# Patient Record
Sex: Female | Born: 1950 | Race: White | Hispanic: No | Marital: Married | Smoking: Never smoker
Health system: Southern US, Community
[De-identification: ages and names within clinical notes are randomized; demographics above are authoritative.]

## PROBLEM LIST (undated history)

## (undated) DIAGNOSIS — E669 Obesity, unspecified: Secondary | ICD-10-CM

## (undated) DIAGNOSIS — M2040 Other hammer toe(s) (acquired), unspecified foot: Secondary | ICD-10-CM

## (undated) DIAGNOSIS — K219 Gastro-esophageal reflux disease without esophagitis: Secondary | ICD-10-CM

## (undated) DIAGNOSIS — J45909 Unspecified asthma, uncomplicated: Secondary | ICD-10-CM

## (undated) DIAGNOSIS — M51369 Other intervertebral disc degeneration, lumbar region without mention of lumbar back pain or lower extremity pain: Secondary | ICD-10-CM

## (undated) DIAGNOSIS — R7303 Prediabetes: Secondary | ICD-10-CM

## (undated) DIAGNOSIS — F325 Major depressive disorder, single episode, in full remission: Secondary | ICD-10-CM

## (undated) DIAGNOSIS — I739 Peripheral vascular disease, unspecified: Secondary | ICD-10-CM

## (undated) DIAGNOSIS — H9192 Unspecified hearing loss, left ear: Secondary | ICD-10-CM

## (undated) DIAGNOSIS — L4050 Arthropathic psoriasis, unspecified: Secondary | ICD-10-CM

## (undated) DIAGNOSIS — E785 Hyperlipidemia, unspecified: Secondary | ICD-10-CM

## (undated) DIAGNOSIS — K259 Gastric ulcer, unspecified as acute or chronic, without hemorrhage or perforation: Secondary | ICD-10-CM

## (undated) DIAGNOSIS — M150 Primary generalized (osteo)arthritis: Secondary | ICD-10-CM

## (undated) DIAGNOSIS — I639 Cerebral infarction, unspecified: Secondary | ICD-10-CM

## (undated) DIAGNOSIS — M5136 Other intervertebral disc degeneration, lumbar region: Secondary | ICD-10-CM

## (undated) DIAGNOSIS — L409 Psoriasis, unspecified: Secondary | ICD-10-CM

## (undated) DIAGNOSIS — H9190 Unspecified hearing loss, unspecified ear: Secondary | ICD-10-CM

## (undated) DIAGNOSIS — M48062 Spinal stenosis, lumbar region with neurogenic claudication: Secondary | ICD-10-CM

## (undated) DIAGNOSIS — Z9889 Other specified postprocedural states: Secondary | ICD-10-CM

## (undated) DIAGNOSIS — T39395A Adverse effect of other nonsteroidal anti-inflammatory drugs [NSAID], initial encounter: Secondary | ICD-10-CM

## (undated) DIAGNOSIS — M545 Low back pain, unspecified: Secondary | ICD-10-CM

## (undated) DIAGNOSIS — R519 Headache, unspecified: Secondary | ICD-10-CM

## (undated) DIAGNOSIS — N809 Endometriosis, unspecified: Secondary | ICD-10-CM

## (undated) DIAGNOSIS — F329 Major depressive disorder, single episode, unspecified: Secondary | ICD-10-CM

## (undated) DIAGNOSIS — C801 Malignant (primary) neoplasm, unspecified: Secondary | ICD-10-CM

## (undated) DIAGNOSIS — I679 Cerebrovascular disease, unspecified: Secondary | ICD-10-CM

## (undated) DIAGNOSIS — F419 Anxiety disorder, unspecified: Secondary | ICD-10-CM

## (undated) DIAGNOSIS — Z974 Presence of external hearing-aid: Secondary | ICD-10-CM

## (undated) DIAGNOSIS — I10 Essential (primary) hypertension: Secondary | ICD-10-CM

## (undated) DIAGNOSIS — G709 Myoneural disorder, unspecified: Secondary | ICD-10-CM

## (undated) DIAGNOSIS — R51 Headache: Secondary | ICD-10-CM

## (undated) DIAGNOSIS — F32A Depression, unspecified: Secondary | ICD-10-CM

## (undated) HISTORY — PX: JOINT REPLACEMENT: SHX530

## (undated) HISTORY — PX: OTHER SURGICAL HISTORY: SHX169

## (undated) HISTORY — PX: COLONOSCOPY: SHX174

## (undated) HISTORY — PX: LIVER BIOPSY: SHX301

## (undated) HISTORY — PX: INNER EAR SURGERY: SHX679

## (undated) HISTORY — PX: CATARACT EXTRACTION: SUR2

## (undated) HISTORY — PX: TONSILLECTOMY: SUR1361

## (undated) SURGERY — Surgical Case
Anesthesia: *Unknown

---

## 1988-02-05 HISTORY — PX: ABDOMINAL HYSTERECTOMY: SHX81

## 2003-12-06 ENCOUNTER — Ambulatory Visit: Payer: Self-pay | Admitting: Internal Medicine

## 2004-02-03 ENCOUNTER — Ambulatory Visit: Payer: Self-pay | Admitting: Unknown Physician Specialty

## 2004-06-18 ENCOUNTER — Ambulatory Visit: Payer: Self-pay

## 2005-01-29 ENCOUNTER — Ambulatory Visit: Payer: Self-pay | Admitting: Internal Medicine

## 2005-08-05 ENCOUNTER — Other Ambulatory Visit: Payer: Self-pay

## 2005-08-12 ENCOUNTER — Inpatient Hospital Stay: Payer: Self-pay | Admitting: Unknown Physician Specialty

## 2005-08-20 ENCOUNTER — Ambulatory Visit: Payer: Self-pay | Admitting: Unknown Physician Specialty

## 2006-02-11 ENCOUNTER — Ambulatory Visit: Payer: Self-pay | Admitting: Internal Medicine

## 2006-07-02 ENCOUNTER — Ambulatory Visit: Payer: Self-pay | Admitting: Unknown Physician Specialty

## 2006-12-17 ENCOUNTER — Ambulatory Visit: Payer: Self-pay | Admitting: Unknown Physician Specialty

## 2007-03-18 ENCOUNTER — Ambulatory Visit: Payer: Self-pay | Admitting: Internal Medicine

## 2007-04-28 ENCOUNTER — Ambulatory Visit: Payer: Self-pay | Admitting: Unknown Physician Specialty

## 2007-05-06 ENCOUNTER — Ambulatory Visit: Payer: Self-pay | Admitting: Unknown Physician Specialty

## 2009-02-01 ENCOUNTER — Ambulatory Visit: Payer: Self-pay | Admitting: Internal Medicine

## 2010-02-02 ENCOUNTER — Ambulatory Visit: Payer: Self-pay | Admitting: Internal Medicine

## 2011-05-17 ENCOUNTER — Ambulatory Visit: Payer: Self-pay | Admitting: Unknown Physician Specialty

## 2011-06-06 ENCOUNTER — Ambulatory Visit: Payer: Self-pay | Admitting: Unknown Physician Specialty

## 2011-07-29 ENCOUNTER — Ambulatory Visit: Payer: Self-pay | Admitting: Internal Medicine

## 2012-05-14 ENCOUNTER — Ambulatory Visit: Payer: Self-pay | Admitting: Ophthalmology

## 2012-05-14 LAB — CREATININE, SERUM
EGFR (African American): >60
EGFR (Non-African Amer.): >60

## 2012-08-04 ENCOUNTER — Ambulatory Visit: Payer: Self-pay | Admitting: Internal Medicine

## 2013-08-31 ENCOUNTER — Ambulatory Visit: Payer: Self-pay | Admitting: Ophthalmology

## 2013-09-14 ENCOUNTER — Ambulatory Visit: Payer: Self-pay | Admitting: Ophthalmology

## 2013-09-21 ENCOUNTER — Ambulatory Visit: Payer: Self-pay | Admitting: Ophthalmology

## 2013-12-15 ENCOUNTER — Ambulatory Visit: Payer: Self-pay | Admitting: Internal Medicine

## 2014-03-29 ENCOUNTER — Ambulatory Visit: Payer: Self-pay | Admitting: Unknown Physician Specialty

## 2014-05-28 NOTE — Op Note (Signed)
PATIENT NAME:  Andrea Fuentes, Andrea Fuentes MR#:  [id number] DATE OF BIRTH:  1950-09-01  DATE OF PROCEDURE:  09/21/2013  PREOPERATIVE DIAGNOSIS: Visually significant cataract of the right eye.   POSTOPERATIVE DIAGNOSIS: Visually significant cataract of the right eye.   OPERATIVE PROCEDURE: Cataract extraction by phacoemulsification with implant of intraocular lens to right eye.   SURGEON: Birder Robson, MD.   ANESTHESIA:  1. Managed anesthesia care.  2. Topical tetracaine drops followed by 2% Xylocaine jelly applied in the preoperative holding area.   COMPLICATIONS: None.   TECHNIQUE:  Stop and chop.   DESCRIPTION OF PROCEDURE: The patient was examined and consented in the preoperative holding area where the aforementioned topical anesthesia was applied to the right eye and then brought back to the operating room where the right eye was prepped and draped in the usual sterile ophthalmic fashion and a lid speculum was placed. A paracentesis was created with the side port blade and the anterior chamber was filled with viscoelastic. A near clear corneal incision was performed with the steel keratome. A continuous curvilinear capsulorrhexis was performed with a cystotome followed by the capsulorrhexis forceps. Hydrodissection and hydrodelineation were carried out with BSS on a blunt cannula. The lens was removed in a stop-and-chop technique and the remaining cortical material was removed with the irrigation-aspiration handpiece. The capsular bag was inflated with viscoelastic and the Tecnis ZCB00, 24.0-diopter lens, serial number [id number] was placed in the capsular bag without complication. The remaining viscoelastic was removed from the eye with the irrigation-aspiration handpiece. The wounds were hydrated. The anterior chamber was flushed with Miostat and the eye was inflated to physiologic pressure. Please note that cefuroxime was not placed within the eye due to PENICILLIN allergy. Rather, a 4:1 dilution  of Vigamox was placed. The wounds were found to be water tight. The eye was dressed with Vigamox. The patient was given protective glasses to wear throughout the day and a shield with which to sleep tonight. The patient was also given drops with which to begin a drop regimen today and will follow-up with me in one day.    ____________________________ Livingston Diones. Vonita Calloway, MD wlp:ST D: 09/21/2013 20:30:21 ET T: 09/21/2013 21:56:58 ET JOB#: [id number]  cc: Shabrea Weldin L. Leenah Seidner, MD, <Dictator> Livingston Diones Muranda Coye MD ELECTRONICALLY SIGNED 09/22/2013 8:51

## 2014-11-24 ENCOUNTER — Other Ambulatory Visit: Payer: Self-pay | Admitting: Internal Medicine

## 2014-11-24 DIAGNOSIS — Z1231 Encounter for screening mammogram for malignant neoplasm of breast: Secondary | ICD-10-CM

## 2014-12-19 ENCOUNTER — Ambulatory Visit
Admission: RE | Admit: 2014-12-19 | Discharge: 2014-12-19 | Disposition: A | Discharge status: Home / Self Care | Payer: Medicare Other | Source: Ambulatory Visit | Attending: Internal Medicine | Admitting: Internal Medicine

## 2014-12-19 ENCOUNTER — Other Ambulatory Visit: Payer: Self-pay | Admitting: Internal Medicine

## 2014-12-19 DIAGNOSIS — Z1231 Encounter for screening mammogram for malignant neoplasm of breast: Secondary | ICD-10-CM

## 2015-02-13 ENCOUNTER — Ambulatory Visit: Admission: RE | Admit: 2015-02-13 | Payer: Medicare Other | Source: Ambulatory Visit | Admitting: Gastroenterology

## 2015-02-13 ENCOUNTER — Encounter: Admission: RE | Payer: Self-pay | Source: Ambulatory Visit

## 2015-02-13 SURGERY — COLONOSCOPY WITH PROPOFOL
Anesthesia: General

## 2015-03-24 ENCOUNTER — Encounter: Payer: Self-pay | Admitting: *Deleted

## 2015-03-27 ENCOUNTER — Ambulatory Visit: Payer: Medicare Other | Admitting: Anesthesiology

## 2015-03-27 ENCOUNTER — Encounter
Admission: RE | Disposition: A | Discharge status: Home Health | Payer: Self-pay | Source: Ambulatory Visit | Attending: Gastroenterology

## 2015-03-27 ENCOUNTER — Ambulatory Visit
Admission: RE | Admit: 2015-03-27 | Discharge: 2015-03-27 | Disposition: A | Discharge status: Home Health | Payer: Medicare Other | Source: Ambulatory Visit | Attending: Gastroenterology | Admitting: Gastroenterology

## 2015-03-27 ENCOUNTER — Encounter: Payer: Self-pay | Admitting: *Deleted

## 2015-03-27 DIAGNOSIS — L408 Other psoriasis: Secondary | ICD-10-CM | POA: Insufficient documentation

## 2015-03-27 DIAGNOSIS — I10 Essential (primary) hypertension: Secondary | ICD-10-CM | POA: Insufficient documentation

## 2015-03-27 DIAGNOSIS — M5416 Radiculopathy, lumbar region: Secondary | ICD-10-CM | POA: Insufficient documentation

## 2015-03-27 DIAGNOSIS — Z6832 Body mass index (BMI) 32.0-32.9, adult: Secondary | ICD-10-CM | POA: Diagnosis not present

## 2015-03-27 DIAGNOSIS — E785 Hyperlipidemia, unspecified: Secondary | ICD-10-CM | POA: Diagnosis not present

## 2015-03-27 DIAGNOSIS — I739 Peripheral vascular disease, unspecified: Secondary | ICD-10-CM | POA: Insufficient documentation

## 2015-03-27 DIAGNOSIS — M545 Low back pain: Secondary | ICD-10-CM | POA: Insufficient documentation

## 2015-03-27 DIAGNOSIS — E669 Obesity, unspecified: Secondary | ICD-10-CM | POA: Insufficient documentation

## 2015-03-27 DIAGNOSIS — Z7982 Long term (current) use of aspirin: Secondary | ICD-10-CM | POA: Diagnosis not present

## 2015-03-27 DIAGNOSIS — M4806 Spinal stenosis, lumbar region: Secondary | ICD-10-CM | POA: Insufficient documentation

## 2015-03-27 DIAGNOSIS — Z90710 Acquired absence of both cervix and uterus: Secondary | ICD-10-CM | POA: Diagnosis not present

## 2015-03-27 DIAGNOSIS — Z1211 Encounter for screening for malignant neoplasm of colon: Secondary | ICD-10-CM | POA: Diagnosis not present

## 2015-03-27 DIAGNOSIS — Z96653 Presence of artificial knee joint, bilateral: Secondary | ICD-10-CM | POA: Diagnosis not present

## 2015-03-27 DIAGNOSIS — J45909 Unspecified asthma, uncomplicated: Secondary | ICD-10-CM | POA: Diagnosis not present

## 2015-03-27 DIAGNOSIS — F419 Anxiety disorder, unspecified: Secondary | ICD-10-CM | POA: Insufficient documentation

## 2015-03-27 DIAGNOSIS — F329 Major depressive disorder, single episode, unspecified: Secondary | ICD-10-CM | POA: Insufficient documentation

## 2015-03-27 DIAGNOSIS — H9192 Unspecified hearing loss, left ear: Secondary | ICD-10-CM | POA: Insufficient documentation

## 2015-03-27 DIAGNOSIS — Z79899 Other long term (current) drug therapy: Secondary | ICD-10-CM | POA: Insufficient documentation

## 2015-03-27 DIAGNOSIS — Z8673 Personal history of transient ischemic attack (TIA), and cerebral infarction without residual deficits: Secondary | ICD-10-CM | POA: Diagnosis not present

## 2015-03-27 HISTORY — DX: Unspecified hearing loss, left ear: H91.92

## 2015-03-27 HISTORY — DX: Hyperlipidemia, unspecified: E78.5

## 2015-03-27 HISTORY — DX: Endometriosis, unspecified: N80.9

## 2015-03-27 HISTORY — DX: Low back pain, unspecified: M54.50

## 2015-03-27 HISTORY — DX: Other specified postprocedural states: Z98.890

## 2015-03-27 HISTORY — DX: Spinal stenosis, lumbar region with neurogenic claudication: M48.062

## 2015-03-27 HISTORY — DX: Low back pain: M54.5

## 2015-03-27 HISTORY — DX: Anxiety disorder, unspecified: F41.9

## 2015-03-27 HISTORY — DX: Unspecified asthma, uncomplicated: J45.909

## 2015-03-27 HISTORY — DX: Psoriasis, unspecified: L40.9

## 2015-03-27 HISTORY — DX: Cerebral infarction, unspecified: I63.9

## 2015-03-27 HISTORY — DX: Major depressive disorder, single episode, unspecified: F32.9

## 2015-03-27 HISTORY — DX: Obesity, unspecified: E66.9

## 2015-03-27 HISTORY — PX: COLONOSCOPY WITH PROPOFOL: SHX5780

## 2015-03-27 HISTORY — DX: Depression, unspecified: F32.A

## 2015-03-27 HISTORY — DX: Essential (primary) hypertension: I10

## 2015-03-27 HISTORY — DX: Peripheral vascular disease, unspecified: I73.9

## 2015-03-27 SURGERY — COLONOSCOPY WITH PROPOFOL
Anesthesia: General

## 2015-03-27 MED ORDER — PROPOFOL 500 MG/50ML IV EMUL
INTRAVENOUS | Status: DC | PRN
  Administered 2015-03-27: 100 ug/kg/min via INTRAVENOUS

## 2015-03-27 MED ORDER — SODIUM CHLORIDE 0.9 % IV SOLN
INTRAVENOUS | Status: DC
  Administered 2015-03-27: 12:00:00 via INTRAVENOUS

## 2015-03-27 MED ORDER — SODIUM CHLORIDE 0.9 % IV SOLN
INTRAVENOUS | Status: DC
  Administered 2015-03-27: 1000 mL via INTRAVENOUS

## 2015-03-27 NOTE — Anesthesia Preprocedure Evaluation (Signed)
Anesthesia Evaluation  Patient identified by MRN, date of birth, ID band Patient awake    Reviewed: Allergy & Precautions, H&P , NPO status , Patient's Chart, lab work & pertinent test results, reviewed documented beta blocker date and time   History of Anesthesia Complications Negative for: history of anesthetic complications  Airway Mallampati: III  TM Distance: >3 FB Neck ROM: full    Dental no notable dental hx. (+) Missing, Chipped, Caps   Pulmonary neg shortness of breath, asthma , neg sleep apnea, neg COPD, neg recent URI,    Pulmonary exam normal breath sounds clear to auscultation       Cardiovascular Exercise Tolerance: Good hypertension, On Medications (-) angina+ Peripheral Vascular Disease  (-) CAD, (-) Past MI, (-) Cardiac Stents and (-) CABG Normal cardiovascular exam(-) dysrhythmias + Valvular Problems/Murmurs  Rhythm:regular Rate:Normal     Neuro/Psych neg Seizures PSYCHIATRIC DISORDERS (Depression) CVA negative psych ROS   GI/Hepatic Neg liver ROS, GERD  ,  Endo/Other  negative endocrine ROS  Renal/GU negative Renal ROS  negative genitourinary   Musculoskeletal   Abdominal   Peds  Hematology negative hematology ROS (+)   Anesthesia Other Findings Past Medical History:   Hyperlipidemia                                               Hypertension                                                 Depression                                                   Peripheral vascular disease (HCC)                            Asthma                                                       Obesity                                                      Anxiety                                                      Low back pain                                                Lumbar radiculitis  Lumbar stenosis with neurogenic claudication                 Stroke [facility])                                                  Deafness in left ear                                         Endometriosis                                                Psoriasis                                                    S/P ear surgery                                              History of esophagogastroduodenoscopy (EGD)                  Reproductive/Obstetrics negative OB ROS                             Anesthesia Physical Anesthesia Plan  ASA: III  Anesthesia Plan: General   Post-op Pain Management:    Induction:   Airway Management Planned:   Additional Equipment:   Intra-op Plan:   Post-operative Plan:   Informed Consent: I have reviewed the patients History and Physical, chart, labs and discussed the procedure including the risks, benefits and alternatives for the proposed anesthesia with the patient or authorized representative who has indicated his/her understanding and acceptance.   Dental Advisory Given  Plan Discussed with: Anesthesiologist, CRNA and Surgeon  Anesthesia Plan Comments:         Anesthesia Quick Evaluation

## 2015-03-27 NOTE — Transfer of Care (Signed)
Immediate Anesthesia Transfer of Care Note  Patient: Andrea Fuentes  Procedure(s) Performed: Procedure(s): COLONOSCOPY WITH PROPOFOL (N/A)  Patient Location: PACU  Anesthesia Type:General  Level of Consciousness: awake, alert  and oriented  Airway & Oxygen Therapy: Patient Spontanous Breathing and Patient connected to nasal cannula oxygen  Post-op Assessment: Report given to RN and Post -op Vital signs reviewed and stable  Post vital signs: Reviewed and stable  Last Vitals:  Filed Vitals:   03/27/15 1054  BP: 141/65  Pulse: 83  Temp: 37.2 C  Resp: 16    Complications: No apparent anesthesia complications and Patient re-intubated

## 2015-03-27 NOTE — Op Note (Signed)
[facility] Gastroenterology Patient Name: Andrea Fuentes Procedure Date: 03/27/2015 11:39 AM MRN: [id number] Account #: [id number] Date of Birth: 1950-09-02 Admit Type: Outpatient Age: 65 Room: [facility] ENDO ROOM 4 Gender: Female Note Status: Finalized Procedure:            Colonoscopy Indications:          Screening for colorectal malignant neoplasm Providers:            Lupita Dawn. Candace Cruise, MD Referring MD:         Ocie Cornfield. Ouida Sills, MD (Referring MD) Medicines:            Monitored Anesthesia Care Complications:        No immediate complications. Procedure:            Pre-Anesthesia Assessment:                       - Prior to the procedure, a History and Physical was                        performed, and patient medications, allergies and                        sensitivities were reviewed. The patient's tolerance of                        previous anesthesia was reviewed.                       - The risks and benefits of the procedure and the                        sedation options and risks were discussed with the                        patient. All questions were answered and informed                        consent was obtained.                       - After reviewing the risks and benefits, the patient                        was deemed in satisfactory condition to undergo the                        procedure.                       After obtaining informed consent, the colonoscope was                        passed under direct vision. Throughout the procedure,                        the patient's blood pressure, pulse, and oxygen                        saturations were monitored continuously. The  Colonoscope was introduced through the anus and                        advanced to the the cecum, identified by appendiceal                        orifice and ileocecal valve. The colonoscopy was                        performed with difficulty due to  restricted mobility of                        the colon. Successful completion of the procedure was                        aided by withdrawing the scope and replacing with the                        pediatric endoscope. The patient tolerated the                        procedure well. The quality of the bowel preparation                        was fair. Findings:      The colon (entire examined portion) appeared normal. Impression:           - Preparation of the colon was fair.                       - The entire examined colon is normal.                       - No specimens collected. Recommendation:       - Discharge patient to home.                       - Repeat colonoscopy in 10 years for surveillance.                       - The findings and recommendations were discussed with                        the patient. Procedure Code(s):    --- Professional ---                       [phone], Colonoscopy, flexible; diagnostic, including                        collection of specimen(s) by brushing or washing, when                        performed (separate procedure) Diagnosis Code(s):    --- Professional ---                       Z12.11, Encounter for screening for malignant neoplasm                        of colon CPT copyright 2016 American Medical Association. All rights reserved. The codes documented in this report  are preliminary and upon coder review may  be revised to meet current compliance requirements. Hulen Luster, MD 03/27/2015 12:06:47 PM This report has been signed electronically. Number of Addenda: 0 Note Initiated On: 03/27/2015 11:39 AM Scope Withdrawal Time: 0 hours 6 minutes 10 seconds  Total Procedure Duration: 0 hours 9 minutes 30 seconds       [facility]

## 2015-03-27 NOTE — H&P (Signed)
Primary Care Physician:  Kirk Ruths., MD Primary Gastroenterologist:  Dr. Candace Cruise  Pre-Procedure History & Physical: HPI:  Andrea Fuentes is a 66 y.o. female is here for an colonoscopy  Past Medical History  Diagnosis Date  . Hyperlipidemia   . Hypertension   . Depression   . Peripheral vascular disease ([place])   . Asthma   . Obesity   . Anxiety   . Low back pain   . Lumbar radiculitis   . Lumbar stenosis with neurogenic claudication   . Stroke ([place])   . Deafness in left ear   . Endometriosis   . Psoriasis   . S/P ear surgery   . History of esophagogastroduodenoscopy (EGD)     Past Surgical History  Procedure Laterality Date  . Abdominal hysterectomy  1990  . Tonsillectomy    . Bladder tack    . Liver biopsy    . Colonoscopy    . Joint replacement Bilateral     partial knee replacement    Prior to Admission medications   Medication Sig Start Date End Date Taking? Authorizing Provider  acetaminophen (TYLENOL) 650 MG CR tablet Take 650 mg by mouth every 8 (eight) hours as needed for pain.   Yes Historical Provider, MD  aspirin 81 MG tablet Take 81 mg by mouth daily.   Yes Historical Provider, MD  atorvastatin (LIPITOR) 80 MG tablet Take 80 mg by mouth daily.   Yes Historical Provider, MD  diclofenac sodium (VOLTAREN) 1 % GEL Apply topically 4 (four) times daily.   Yes Historical Provider, MD  diphenhydrAMINE (BENADRYL) 25 mg capsule Take 25 mg by mouth every 6 (six) hours as needed.   Yes Historical Provider, MD  fexofenadine (ALLEGRA) 180 MG tablet Take 180 mg by mouth daily.   Yes Historical Provider, MD  fluticasone (FLONASE) 50 MCG/ACT nasal spray Place 2 sprays into both nostrils daily.   Yes Historical Provider, MD  gabapentin (NEURONTIN) 300 MG capsule Take 300 mg by mouth as directed.   Yes Historical Provider, MD  lisinopril (PRINIVIL,ZESTRIL) 10 MG tablet Take 10 mg by mouth daily.   Yes Historical Provider, MD  omeprazole (PRILOSEC) 20 MG capsule  Take 20 mg by mouth daily.   Yes Historical Provider, MD  traMADol (ULTRAM) 50 MG tablet Take by mouth 4 (four) times daily.   Yes Historical Provider, MD  venlafaxine (EFFEXOR) 75 MG tablet Take 75 mg by mouth daily.   Yes Historical Provider, MD    Allergies as of 03/07/2015  . (Not on File)    History reviewed. No pertinent family history.  Social History   Social History  . Marital Status: Married    Spouse Name: N/A  . Number of Children: N/A  . Years of Education: N/A   Occupational History  . Not on file.   Social History Main Topics  . Smoking status: Never Smoker   . Smokeless tobacco: Never Used  . Alcohol Use: No  . Drug Use: No  . Sexual Activity: Not on file   Other Topics Concern  . Not on file   Social History Narrative    Review of Systems: See HPI, otherwise negative ROS  Physical Exam: BP 141/65 mmHg  Pulse 83  Temp(Src) 98.9 F (37.2 C) (Oral)  Resp 16  Ht 5\' 3"  (1.6 m)  Wt 83.008 kg (183 lb)  BMI 32.43 kg/m2  SpO2 100% General:   Alert,  pleasant and cooperative in NAD Head:  Normocephalic and  atraumatic. Neck:  Supple; no masses or thyromegaly. Lungs:  Clear throughout to auscultation.    Heart:  Regular rate and rhythm. Abdomen:  Soft, nontender and nondistended. Normal bowel sounds, without guarding, and without rebound.   Neurologic:  Alert and  oriented x4;  grossly normal neurologically.  Impression/Plan: Andrea Fuentes is here for an colonoscopy to be performed for screening.  Risks, benefits, limitations, and alternatives regarding colonoscopy have been reviewed with the patient.  Questions have been answered.  All parties agreeable.   Andrea Fuentes, Andrea Dawn, MD  03/27/2015, 11:01 AM

## 2015-03-27 NOTE — Anesthesia Postprocedure Evaluation (Signed)
Anesthesia Post Note  Patient: Barbie Govert  Procedure(s) Performed: Procedure(s) (LRB): COLONOSCOPY WITH PROPOFOL (N/A)  Patient location during evaluation: Endoscopy Anesthesia Type: General Level of consciousness: awake and alert Pain management: pain level controlled Vital Signs Assessment: post-procedure vital signs reviewed and stable Respiratory status: spontaneous breathing, nonlabored ventilation, respiratory function stable and patient connected to nasal cannula oxygen Cardiovascular status: blood pressure returned to baseline and stable Postop Assessment: no signs of nausea or vomiting Anesthetic complications: no    Last Vitals:  Filed Vitals:   03/27/15 1230 03/27/15 1238  BP: 121/77 119/66  Pulse: 84 79  Temp:    Resp: 19 14    Last Pain:  Filed Vitals:   03/27/15 1312  PainSc: 2                  Arthi Clan

## 2015-03-28 ENCOUNTER — Encounter: Payer: Self-pay | Admitting: Gastroenterology

## 2015-07-24 NOTE — Discharge Instructions (Signed)
[facility] °[facility] ° °POST OPERATIVE INSTRUCTIONS FOR DR. TROXLER AND DR. FOWLER °[facility] ° ° °1. Take your medication as prescribed.  Pain medication should be taken only as needed. ° °2. Keep the dressing clean, dry and intact. ° °3. Keep your foot elevated above the heart level for the first 48 hours. ° °4. Walking to the bathroom and brief periods of walking are acceptable, unless we have instructed you to be non-weight bearing. ° °5. Always wear your post-op shoe when walking.  Always use your crutches if you are to be non-weight bearing. ° °6. Do not take a shower. Baths are permissible as long as the foot is kept out of the water.  ° °7. Every hour you are awake:  °- Bend your knee 15 times. °- Flex foot 15 times °- Massage calf 15 times ° °8. Call [facility] ([phone]) if any of the following problems occur: °- You develop a temperature or fever. °- The bandage becomes saturated with blood. °- Medication does not stop your pain. °- Injury of the foot occurs. °- Any symptoms of infection including redness, odor, or red streaks running from wound. ° °General Anesthesia, Adult, Care After °Refer to this sheet in the next few weeks. These instructions provide you with information on caring for yourself after your procedure. Your health care provider may also give you more specific instructions. Your treatment has been planned according to current medical practices, but problems sometimes occur. Call your health care provider if you have any problems or questions after your procedure. °WHAT TO EXPECT AFTER THE PROCEDURE °After the procedure, it is typical to experience: °· Sleepiness. °· Nausea and vomiting. °HOME CARE INSTRUCTIONS °· For the first 24 hours after general anesthesia: °¨ Have a responsible person with you. °¨ Do not drive a car. If you are alone, do not take public transportation. °¨ Do not drink alcohol. °¨ Do not take  medicine that has not been prescribed by your health care provider. °¨ Do not sign important papers or make important decisions. °¨ You may resume a normal diet and activities as directed by your health care provider. °· Change bandages (dressings) as directed. °· If you have questions or problems that seem related to general anesthesia, call the hospital and ask for the anesthetist or anesthesiologist on call. °SEEK MEDICAL CARE IF: °· You have nausea and vomiting that continue the day after anesthesia. °· You develop a rash. °SEEK IMMEDIATE MEDICAL CARE IF:  °· You have difficulty breathing. °· You have chest pain. °· You have any allergic problems. °  °This information is not intended to replace advice given to you by your health care provider. Make sure you discuss any questions you have with your health care provider. °  °Document Released: 04/29/2000 Document Revised: 02/11/2014 Document Reviewed: 05/22/2011 °Elsevier Interactive Patient Education ©2016 [employer] ° °

## 2015-07-26 ENCOUNTER — Ambulatory Visit: Payer: Medicare Other | Admitting: Anesthesiology

## 2015-07-26 ENCOUNTER — Ambulatory Visit
Admission: RE | Admit: 2015-07-26 | Discharge: 2015-07-26 | Disposition: A | Discharge status: Home / Self Care | Payer: Medicare Other | Source: Ambulatory Visit | Attending: Podiatry | Admitting: Podiatry

## 2015-07-26 ENCOUNTER — Encounter
Admission: RE | Disposition: A | Discharge status: Home / Self Care | Payer: Self-pay | Source: Ambulatory Visit | Attending: Podiatry

## 2015-07-26 DIAGNOSIS — I739 Peripheral vascular disease, unspecified: Secondary | ICD-10-CM | POA: Insufficient documentation

## 2015-07-26 DIAGNOSIS — J45909 Unspecified asthma, uncomplicated: Secondary | ICD-10-CM | POA: Insufficient documentation

## 2015-07-26 DIAGNOSIS — F418 Other specified anxiety disorders: Secondary | ICD-10-CM | POA: Diagnosis not present

## 2015-07-26 DIAGNOSIS — M899 Disorder of bone, unspecified: Secondary | ICD-10-CM | POA: Diagnosis not present

## 2015-07-26 DIAGNOSIS — I10 Essential (primary) hypertension: Secondary | ICD-10-CM | POA: Insufficient documentation

## 2015-07-26 DIAGNOSIS — Z8673 Personal history of transient ischemic attack (TIA), and cerebral infarction without residual deficits: Secondary | ICD-10-CM | POA: Diagnosis not present

## 2015-07-26 DIAGNOSIS — K219 Gastro-esophageal reflux disease without esophagitis: Secondary | ICD-10-CM | POA: Diagnosis not present

## 2015-07-26 DIAGNOSIS — M1990 Unspecified osteoarthritis, unspecified site: Secondary | ICD-10-CM | POA: Diagnosis not present

## 2015-07-26 DIAGNOSIS — Z8711 Personal history of peptic ulcer disease: Secondary | ICD-10-CM | POA: Diagnosis not present

## 2015-07-26 DIAGNOSIS — R51 Headache: Secondary | ICD-10-CM | POA: Insufficient documentation

## 2015-07-26 DIAGNOSIS — M2041 Other hammer toe(s) (acquired), right foot: Secondary | ICD-10-CM | POA: Insufficient documentation

## 2015-07-26 HISTORY — DX: Adverse effect of other nonsteroidal anti-inflammatory drugs (NSAID), initial encounter: T39.395A

## 2015-07-26 HISTORY — DX: Gastric ulcer, unspecified as acute or chronic, without hemorrhage or perforation: K25.9

## 2015-07-26 HISTORY — DX: Other intervertebral disc degeneration, lumbar region without mention of lumbar back pain or lower extremity pain: M51.369

## 2015-07-26 HISTORY — DX: Other intervertebral disc degeneration, lumbar region: M51.36

## 2015-07-26 HISTORY — DX: Other hammer toe(s) (acquired), unspecified foot: M20.40

## 2015-07-26 HISTORY — PX: EXCISION PARTIAL PHALANX: SHX6617

## 2015-07-26 HISTORY — DX: Myoneural disorder, unspecified: G70.9

## 2015-07-26 HISTORY — DX: Headache, unspecified: R51.9

## 2015-07-26 HISTORY — DX: Unspecified hearing loss, unspecified ear: H91.90

## 2015-07-26 HISTORY — DX: Headache: R51

## 2015-07-26 HISTORY — PX: HAMMER TOE SURGERY: SHX385

## 2015-07-26 SURGERY — EXCISION, PHALANX, PARTIAL
Anesthesia: Monitor Anesthesia Care | Site: Toe | Laterality: Right | Wound class: Clean

## 2015-07-26 MED ORDER — BUPIVACAINE HCL 0.5 % IJ SOLN
INTRAMUSCULAR | Status: DC | PRN
  Administered 2015-07-26: 7 mL via INTRAMUSCULAR

## 2015-07-26 MED ORDER — OXYCODONE HCL 5 MG PO TABS: 5.0000 mg | ORAL_TABLET | Freq: Once | ORAL | Status: DC | PRN

## 2015-07-26 MED ORDER — HYDROMORPHONE HCL 1 MG/ML IJ SOLN: 0.2500 mg | INTRAMUSCULAR | Status: DC | PRN

## 2015-07-26 MED ORDER — PROMETHAZINE HCL 25 MG/ML IJ SOLN: 6.2500 mg | INTRAMUSCULAR | Status: DC | PRN

## 2015-07-26 MED ORDER — LIDOCAINE HCL (CARDIAC) 20 MG/ML IV SOLN
INTRAVENOUS | Status: DC | PRN
  Administered 2015-07-26: 40 mg via INTRAVENOUS

## 2015-07-26 MED ORDER — CLINDAMYCIN PHOSPHATE 600 MG/50ML IV SOLN
600.0000 mg | Freq: Once | INTRAVENOUS | Status: AC
  Administered 2015-07-26: 600 mg via INTRAVENOUS

## 2015-07-26 MED ORDER — MIDAZOLAM HCL 2 MG/2ML IJ SOLN
INTRAMUSCULAR | Status: DC | PRN
  Administered 2015-07-26: 2 mg via INTRAVENOUS

## 2015-07-26 MED ORDER — OXYCODONE-ACETAMINOPHEN 5-325 MG PO TABS: 1.0000 | ORAL_TABLET | ORAL | Status: DC | PRN

## 2015-07-26 MED ORDER — FENTANYL CITRATE (PF) 100 MCG/2ML IJ SOLN
INTRAMUSCULAR | Status: DC | PRN
  Administered 2015-07-26 (×2): 50 ug via INTRAVENOUS

## 2015-07-26 MED ORDER — MEPERIDINE HCL 25 MG/ML IJ SOLN: 6.2500 mg | INTRAMUSCULAR | Status: DC | PRN

## 2015-07-26 MED ORDER — OXYCODONE HCL 5 MG/5ML PO SOLN
5.0000 mg | Freq: Once | ORAL | Status: DC | PRN
Start: 2015-07-26 — End: 2015-07-26

## 2015-07-26 MED ORDER — BUPIVACAINE HCL (PF) 0.5 % IJ SOLN
INTRAMUSCULAR | Status: DC | PRN
  Administered 2015-07-26: 4 mL

## 2015-07-26 MED ORDER — LACTATED RINGERS IV SOLN
INTRAVENOUS | Status: DC
  Administered 2015-07-26: 11:00:00 via INTRAVENOUS

## 2015-07-26 MED ORDER — PROPOFOL 500 MG/50ML IV EMUL
INTRAVENOUS | Status: DC | PRN
  Administered 2015-07-26: 100 ug/kg/min via INTRAVENOUS

## 2015-07-26 SURGICAL SUPPLY — 47 items
APL SKNCLS STERI-STRIP NONHPOA (GAUZE/BANDAGES/DRESSINGS) ×1
BANDAGE ELASTIC 4 VELCRO NS (GAUZE/BANDAGES/DRESSINGS) ×2 IMPLANT
BENZOIN TINCTURE PRP APPL 2/3 (GAUZE/BANDAGES/DRESSINGS) ×2 IMPLANT
BLADE MED AGGRESSIVE (BLADE) IMPLANT
BLADE OSC/SAGITTAL MD 5.5X18 (BLADE) ×2 IMPLANT
BLADE SURG 15 STRL LF DISP TIS (BLADE) IMPLANT
BLADE SURG 15 STRL SS (BLADE)
BNDG CMPR 75X41 PLY HI ABS (GAUZE/BANDAGES/DRESSINGS) ×1
BNDG ESMARK 4X12 TAN STRL LF (GAUZE/BANDAGES/DRESSINGS) ×2 IMPLANT
BNDG GAUZE 4.5X4.1 6PLY STRL (MISCELLANEOUS) ×2 IMPLANT
BNDG STRETCH 4X75 STRL LF (GAUZE/BANDAGES/DRESSINGS) ×2 IMPLANT
CANISTER SUCT 1200ML W/VALVE (MISCELLANEOUS) ×2 IMPLANT
COVER LIGHT HANDLE UNIVERSAL (MISCELLANEOUS) ×4 IMPLANT
CUFF TOURN SGL QUICK 18 (TOURNIQUET CUFF) IMPLANT
DRAPE FLUOR MINI C-ARM 54X84 (DRAPES) ×2 IMPLANT
DURAPREP 26ML APPLICATOR (WOUND CARE) ×2 IMPLANT
GAUZE PETRO XEROFOAM 1X8 (MISCELLANEOUS) ×2 IMPLANT
GAUZE SPONGE 4X4 12PLY STRL (GAUZE/BANDAGES/DRESSINGS) ×2 IMPLANT
GLOVE BIO SURGEON STRL SZ7.5 (GLOVE) ×2 IMPLANT
GLOVE INDICATOR 8.0 STRL GRN (GLOVE) ×2 IMPLANT
GOWN STRL REUS W/ TWL LRG LVL3 (GOWN DISPOSABLE) ×2 IMPLANT
GOWN STRL REUS W/TWL LRG LVL3 (GOWN DISPOSABLE) ×4
K-WIRE DBL END TROCAR 6X.045 (WIRE)
K-WIRE DBL END TROCAR 6X.062 (WIRE)
KIT ROOM TURNOVER OR (KITS) ×2 IMPLANT
KWIRE DBL END TROCAR 6X.045 (WIRE) IMPLANT
KWIRE DBL END TROCAR 6X.062 (WIRE) IMPLANT
MICORAIRE K-WIRE 0.045 ×2 IMPLANT
NS IRRIG 500ML POUR BTL (IV SOLUTION) ×2 IMPLANT
PACK EXTREMITY ARMC (MISCELLANEOUS) ×2 IMPLANT
PAD GROUND ADULT SPLIT (MISCELLANEOUS) ×2 IMPLANT
PIN BALLS 3/8 F/.045 WIRE (MISCELLANEOUS) ×4 IMPLANT
RASP SM TEAR CROSS CUT (RASP) IMPLANT
STOCKINETTE STRL 6IN 960660 (GAUZE/BANDAGES/DRESSINGS) ×2 IMPLANT
STRAP BODY AND KNEE 60X3 (MISCELLANEOUS) ×2 IMPLANT
STRIP CLOSURE SKIN 1/4X4 (GAUZE/BANDAGES/DRESSINGS) ×2 IMPLANT
SUT ETHILON 4-0 (SUTURE) ×4
SUT ETHILON 4-0 FS2 18XMFL BLK (SUTURE) ×2
SUT ETHILON 5-0 FS-2 18 BLK (SUTURE) IMPLANT
SUT MNCRL 5-0+ PC-1 (SUTURE) IMPLANT
SUT MONOCRYL 5-0 (SUTURE)
SUT VIC AB 2-0 SH 27 (SUTURE)
SUT VIC AB 2-0 SH 27XBRD (SUTURE) IMPLANT
SUT VIC AB 3-0 SH 27 (SUTURE)
SUT VIC AB 3-0 SH 27X BRD (SUTURE) IMPLANT
SUT VIC AB 4-0 FS2 27 (SUTURE) ×2 IMPLANT
SUTURE ETHLN 4-0 FS2 18XMF BLK (SUTURE) ×2 IMPLANT

## 2015-07-26 NOTE — H&P (Signed)
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  The various methods of treatment have been discussed with the patient.  No guarantees were given.  After consideration of risks, benefits and other options for treatment, the patient has consented to surgery.  I have reviewed the patients' chart and labs.    Patient Vitals for the past 24 hrs:  BP Temp Temp src Pulse Resp SpO2 Height Weight  07/26/15 1119 126/73 mmHg 98.2 F (36.8 C) Temporal 81 16 98 % 5\' 3"  (1.6 m) 82.101 kg (181 lb)    A history and physical examination was performed in my office.  The patient was reexamined.  There have been no changes to this history and physical examination.  Plan for PIPJ fusion 4th toe and exostectomy 3rd toe pipj right foot.   [name] A

## 2015-07-26 NOTE — Anesthesia Postprocedure Evaluation (Signed)
Anesthesia Post Note  Patient: Andrea Fuentes  Procedure(s) Performed: Procedure(s) (LRB): EXCISION PARTIAL PHALANX RIGHT 3RD TOE (Right) HAMMER TOE CORRECTION RIGHT 4TH TOE (Right)  Patient location during evaluation: PACU Anesthesia Type: MAC Level of consciousness: awake and alert and oriented Pain management: pain level controlled Vital Signs Assessment: post-procedure vital signs reviewed and stable Respiratory status: spontaneous breathing and nonlabored ventilation Cardiovascular status: stable Postop Assessment: no signs of nausea or vomiting and adequate PO intake Anesthetic complications: no    Estill Batten

## 2015-07-26 NOTE — Transfer of Care (Signed)
Immediate Anesthesia Transfer of Care Note  Patient: Andrea Fuentes  Procedure(s) Performed: Procedure(s) with comments: EXCISION PARTIAL PHALANX RIGHT 3RD TOE (Right) - WITH LOCAL HAMMER TOE CORRECTION RIGHT 4TH TOE (Right)  Patient Location: PACU  Anesthesia Type: MAC  Level of Consciousness: awake, alert  and patient cooperative  Airway and Oxygen Therapy: Patient Spontanous Breathing and Patient connected to supplemental oxygen  Post-op Assessment: Post-op Vital signs reviewed, Patient's Cardiovascular Status Stable, Respiratory Function Stable, Patent Airway and No signs of Nausea or vomiting  Post-op Vital Signs: Reviewed and stable  Complications: No apparent anesthesia complications

## 2015-07-26 NOTE — Op Note (Signed)
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 Arthrodesis PIPJ right fourth toe  2. Exostectomy PIPJ right third toe    EBL: Minimal    Anesthesia:local and IV sedation    Hemostasis: Ankle tourniquet inflated to 250 mmHg for approximately 55 minutes    Specimen: None    Complications: None    Operative indications:Andrea Fuentes is an 65 y.o. that presents today for surgical intervention.  The risks/benefits/alternatives/complications have been discussed and consent has been given.    Procedure:  Patient was brought into the OR and placed on the operating table in thesupine position. After anesthesia was obtained theright lower extremity was prepped and draped in usual sterile fashion.  Attention was directed to the dorsal aspect of the third toe at the PIPJ where a longitudinal incision was made just lateral to midline. Sharp and blunt dissection down to the extensor tendon. Extensor tendon was noted and reflected medially. Subperiosteal dissection was taken along the lateral aspect of the proximal interphalangeal joint. These prominent exostosis was exposed and this was transected with a power saw. This wound was flushed with copious amount of irrigation and layered closure was performed with a 4-0 Vicryl for the deeper layers and a 4-0 nylon for skin.  Second incision was made overlying the PIPJ of the fourth toe extending from the MTPJ to the PIPJ. Sharp and blunt dissection carried down to the long extensor tendon. This was transected and reflected proximally at the PIPJ. The head of the proximal phalanx and base of the middle phalanx were removed down to good bleeding bone. At this time a 0.045 K wire was driven from the proximal interphalangeal joint to the tip of the toe and retrograded back into the base of the proximal phalanx. Good  alignment and bone contact was noted. The wound was flushed with copious varus or irrigation. The extensor tendon was repaired with a 4-0 Vicryl and the subtendinous tissue repaired with 4-0 Vicryl and the skin with 4-0 nylon. 0.5% Marcaine was placed around all areas. A well compressive sterile dressing was then applied.    Patient tolerated the procedure and anesthesia well.  Was transported from the OR to the PACU with all vital signs stable and vascular status intact. Good cap fill time was noted to all digits. To be discharged per routine protocol.  Will follow up in approximately 1 week in the outpatient clinic.

## 2015-07-26 NOTE — Anesthesia Preprocedure Evaluation (Signed)
Anesthesia Evaluation  Patient identified by MRN, date of birth, ID band Patient awake    Reviewed: Allergy & Precautions, H&P , NPO status , Patient's Chart, lab work & pertinent test results, reviewed documented beta blocker date and time   History of Anesthesia Complications Negative for: history of anesthetic complications  Airway Mallampati: III  TM Distance: >3 FB Neck ROM: full    Dental no notable dental hx. (+) Missing, Chipped, Caps   Pulmonary asthma ,    Pulmonary exam normal breath sounds clear to auscultation       Cardiovascular Exercise Tolerance: Good hypertension, On Medications + Peripheral Vascular Disease  Normal cardiovascular exam(-) dysrhythmias + Valvular Problems/Murmurs  Rhythm:regular Rate:Normal     Neuro/Psych  Headaches, PSYCHIATRIC DISORDERS (Depression) Anxiety Depression CVA negative psych ROS   GI/Hepatic Neg liver ROS, PUD (H/O ulcer 2/2 to NSAID use), GERD  ,  Endo/Other  negative endocrine ROS  Renal/GU negative Renal ROS  negative genitourinary   Musculoskeletal  (+) Arthritis , Osteoarthritis,    Abdominal   Peds  Hematology negative hematology ROS (+)   Anesthesia Other Findings              Reproductive/Obstetrics negative OB ROS                             Anesthesia Physical  Anesthesia Plan  ASA: III  Anesthesia Plan: MAC   Post-op Pain Management:    Induction: Intravenous  Airway Management Planned:   Additional Equipment:   Intra-op Plan:   Post-operative Plan:   Informed Consent: I have reviewed the patients History and Physical, chart, labs and discussed the procedure including the risks, benefits and alternatives for the proposed anesthesia with the patient or authorized representative who has indicated his/her understanding and acceptance.     Plan Discussed with: CRNA  Anesthesia Plan Comments:          Anesthesia Quick Evaluation

## 2015-07-26 NOTE — Anesthesia Procedure Notes (Signed)
Procedure Name: MAC Performed by: Breton Berns Pre-anesthesia Checklist: Patient identified, Emergency Drugs available, Suction available, Patient being monitored and Timeout performed Patient Re-evaluated:Patient Re-evaluated prior to inductionOxygen Delivery Method: Simple face mask Placement Confirmation: positive ETCO2 and breath sounds checked- equal and bilateral       

## 2015-07-27 ENCOUNTER — Encounter: Payer: Self-pay | Admitting: Podiatry

## 2015-12-11 ENCOUNTER — Other Ambulatory Visit: Payer: Self-pay | Admitting: Internal Medicine

## 2015-12-11 DIAGNOSIS — Z1231 Encounter for screening mammogram for malignant neoplasm of breast: Secondary | ICD-10-CM

## 2016-01-02 ENCOUNTER — Ambulatory Visit
Admission: RE | Admit: 2016-01-02 | Discharge: 2016-01-02 | Disposition: A | Discharge status: Home / Self Care | Payer: Medicare Other | Source: Ambulatory Visit | Attending: Internal Medicine | Admitting: Internal Medicine

## 2016-01-02 DIAGNOSIS — Z1231 Encounter for screening mammogram for malignant neoplasm of breast: Secondary | ICD-10-CM | POA: Diagnosis not present

## 2016-07-02 ENCOUNTER — Ambulatory Visit
Admission: RE | Admit: 2016-07-02 | Discharge: 2016-07-02 | Disposition: A | Discharge status: Home / Self Care | Payer: Medicare Other | Source: Ambulatory Visit | Attending: Physician Assistant | Admitting: Physician Assistant

## 2016-07-02 ENCOUNTER — Other Ambulatory Visit: Payer: Self-pay | Admitting: Physician Assistant

## 2016-07-02 DIAGNOSIS — R51 Headache: Secondary | ICD-10-CM | POA: Diagnosis present

## 2016-07-02 DIAGNOSIS — R519 Headache, unspecified: Secondary | ICD-10-CM

## 2016-07-02 DIAGNOSIS — I739 Peripheral vascular disease, unspecified: Secondary | ICD-10-CM | POA: Insufficient documentation

## 2016-07-02 DIAGNOSIS — S0993XA Unspecified injury of face, initial encounter: Secondary | ICD-10-CM | POA: Diagnosis present

## 2016-07-02 DIAGNOSIS — I6523 Occlusion and stenosis of bilateral carotid arteries: Secondary | ICD-10-CM | POA: Insufficient documentation

## 2016-07-02 DIAGNOSIS — M542 Cervicalgia: Secondary | ICD-10-CM | POA: Diagnosis present

## 2016-07-02 DIAGNOSIS — M47812 Spondylosis without myelopathy or radiculopathy, cervical region: Secondary | ICD-10-CM | POA: Insufficient documentation

## 2016-07-02 DIAGNOSIS — G319 Degenerative disease of nervous system, unspecified: Secondary | ICD-10-CM | POA: Insufficient documentation

## 2018-06-09 ENCOUNTER — Encounter: Admission: RE | Payer: Self-pay | Source: Home / Self Care

## 2018-06-09 ENCOUNTER — Ambulatory Visit: Admission: RE | Admit: 2018-06-09 | Payer: Medicare Other | Source: Home / Self Care | Admitting: Ophthalmology

## 2018-06-09 SURGERY — BLEPHAROPLASTY
Anesthesia: Monitor Anesthesia Care

## 2018-10-15 ENCOUNTER — Other Ambulatory Visit: Payer: Self-pay | Admitting: Internal Medicine

## 2018-10-15 DIAGNOSIS — Z1231 Encounter for screening mammogram for malignant neoplasm of breast: Secondary | ICD-10-CM

## 2018-11-03 ENCOUNTER — Ambulatory Visit
Admission: RE | Admit: 2018-11-03 | Discharge: 2018-11-03 | Disposition: A | Discharge status: Home / Self Care | Payer: Medicare Other | Source: Ambulatory Visit | Attending: Internal Medicine | Admitting: Internal Medicine

## 2018-11-03 ENCOUNTER — Other Ambulatory Visit: Payer: Self-pay

## 2018-11-03 DIAGNOSIS — Z1231 Encounter for screening mammogram for malignant neoplasm of breast: Secondary | ICD-10-CM | POA: Insufficient documentation

## 2019-12-23 ENCOUNTER — Ambulatory Visit: Payer: Medicare Other

## 2019-12-23 ENCOUNTER — Other Ambulatory Visit: Payer: Self-pay | Admitting: Internal Medicine

## 2019-12-23 DIAGNOSIS — Z1231 Encounter for screening mammogram for malignant neoplasm of breast: Secondary | ICD-10-CM

## 2020-02-08 ENCOUNTER — Ambulatory Visit: Payer: Medicare Other

## 2020-02-17 ENCOUNTER — Other Ambulatory Visit: Payer: Self-pay

## 2020-02-17 ENCOUNTER — Ambulatory Visit
Admission: RE | Admit: 2020-02-17 | Discharge: 2020-02-17 | Disposition: A | Discharge status: Home / Self Care | Payer: Medicare Other | Source: Ambulatory Visit | Attending: Internal Medicine | Admitting: Internal Medicine

## 2020-02-17 DIAGNOSIS — Z1231 Encounter for screening mammogram for malignant neoplasm of breast: Secondary | ICD-10-CM | POA: Diagnosis present

## 2020-02-17 HISTORY — DX: Malignant (primary) neoplasm, unspecified: C80.1

## 2020-03-13 ENCOUNTER — Other Ambulatory Visit: Payer: Self-pay | Admitting: Physical Medicine and Rehabilitation

## 2020-03-13 DIAGNOSIS — M5416 Radiculopathy, lumbar region: Secondary | ICD-10-CM

## 2020-03-21 ENCOUNTER — Ambulatory Visit
Admission: RE | Admit: 2020-03-21 | Discharge: 2020-03-21 | Disposition: A | Discharge status: Home / Self Care | Payer: Medicare Other | Source: Ambulatory Visit | Attending: Physical Medicine and Rehabilitation | Admitting: Physical Medicine and Rehabilitation

## 2020-03-21 ENCOUNTER — Other Ambulatory Visit: Payer: Self-pay

## 2020-03-21 DIAGNOSIS — M5416 Radiculopathy, lumbar region: Secondary | ICD-10-CM | POA: Insufficient documentation

## 2020-10-31 ENCOUNTER — Encounter: Payer: Self-pay | Admitting: Otolaryngology

## 2020-11-06 NOTE — Discharge Instructions (Signed)
[facility] ENDOSCOPIC SINUS SURGERY [facility]  What is Functional Endoscopic Sinus Surgery?  The Surgery involves making the natural openings of the sinuses larger by removing the bony partitions that separate the sinuses from the nasal cavity.  The natural sinus lining is preserved as much as possible to allow the sinuses to resume normal function after the surgery.  In some patients nasal polyps (excessively swollen lining of the sinuses) may be removed to relieve obstruction of the sinus openings.  The surgery is performed through the nose using lighted scopes, which eliminates the need for incisions on the face.  A septoplasty is a different procedure which is sometimes performed with sinus surgery.  It involves straightening the boy partition that separates the two sides of your nose.  A crooked or deviated septum may need repair if is obstructing the sinuses or nasal airflow.  Turbinate reduction is also often performed during sinus surgery.  The turbinates are bony proturberances from the side walls of the nose which swell and can obstruct the nose in patients with sinus and allergy problems.  Their size can be surgically reduced to help relieve nasal obstruction.  What Can Sinus Surgery Do For Me?  Sinus surgery can reduce the frequency of sinus infections requiring antibiotic treatment.  This can provide improvement in nasal congestion, post-nasal drainage, facial pressure and nasal obstruction.  Surgery will NOT prevent you from ever having an infection again, so it usually only for patients who get infections 4 or more times yearly requiring antibiotics, or for infections that do not clear with antibiotics.  It will not cure nasal allergies, so patients with allergies may still require medication to treat their allergies after surgery. Surgery may improve headaches related to sinusitis, however, some people will continue to  require medication to control sinus headaches related to allergies.  Surgery will do nothing for other forms of headache (migraine, tension or cluster).  What Are the Risks of Endoscopic Sinus Surgery?  Current techniques allow surgery to be performed safely with little risk, however, there are rare complications that patients should be aware of.  Because the sinuses are located around the eyes, there is risk of eye injury, including blindness, though again, this would be quite rare. This is usually a result of bleeding behind the eye during surgery, which can effect vision, though there are treatments to protect the vision and prevent permanent injury. More serious complications would include bleeding inside the brain cavity or damage to the brain.This happens when the fluid around the brain leaks out into the sinus cavity.  Again, all of these complications are uncommon, and spinal fluid leaks can be safely managed surgically if they occur.  The most common complication of sinus surgery is bleeding from the nose, which may require packing or cauterization of the nose.  Patients with polyps may experience recurrence of the polyps that would require revision surgery.  Alterations of sense of smell or injury to the tear ducts are also rare complications.   What is the Surgery Like, and what is the Recovery?  The Surgery usually takes a couple of hours to perform, and is usually performed under a general anesthetic (completely asleep).  Patients are usually discharged home after a couple of hours.  Sometimes during surgery it is necessary to pack the nose to control bleeding, and the packing is left in place for 24 - 48 hours, and removed by your surgeon.  If   a septoplasty was performed during the procedure, there is often a splint placed which must be removed after 5-7 days.   Discomfort: Pain is usually mild to moderate, and can be controlled by prescription pain medication or acetaminophen (Tylenol).   Aspirin, Ibuprofen (Advil, Motrin), or Naprosyn (Aleve) should be avoided, as they can cause increased bleeding.  Most patients feel sinus pressure like they have a bad head cold for several days.  Sleeping with your head elevated can help reduce swelling and facial pressure, as can ice packs over the face.  A humidifier may be helpful to keep the mucous and blood from drying in the nose.   Diet: There are no specific diet restrictions, however, you should generally start with clear liquids and a light diet of bland foods because the anesthetic can cause some nausea.  Advance your diet depending on how your stomach feels.  Taking your pain medication with food will often help reduce stomach upset which pain medications can cause.  Nasal Saline Irrigation: It is important to remove blood clots and dried mucous from the nose as it is healing.  This is done by having you irrigate the nose at least 3 - 4 times daily with a salt water solution.  We recommend using NeilMed Sinus Rinse (available at the drug store).  Fill the squeeze bottle with the solution, bend over a sink, and insert the tip of the squeeze bottle into the nose  of an inch.  Point the tip of the squeeze bottle towards the inside corner of the eye on the same side your irrigating.  Squeeze the bottle and gently irrigate the nose.  If you bend forward as you do this, most of the fluid will flow back out of the nose, instead of down your throat.   The solution should be warm, near body temperature, when you irrigate.   Each time you irrigate, you should use a full squeeze bottle.   Note that if you are instructed to use Nasal Steroid Sprays at any time after your surgery, irrigate with saline BEFORE using the steroid spray, so you do not wash it all out of the nose. Another product, Nasal Saline Gel (such as AYR Nasal Saline Gel) can be applied in each nostril 3 - 4 times daily to moisture the nose and reduce scabbing or crusting.  Bleeding:   Bloody drainage from the nose can be expected for several days, and patients are instructed to irrigate their nose frequently with salt water to help remove mucous and blood clots.  The drainage may be dark red or brown, though some fresh blood may be seen intermittently, especially after irrigation.  Do not blow you nose, as bleeding may occur. If you must sneeze, keep your mouth open to allow air to escape through your mouth.  If heavy bleeding occurs: Irrigate the nose with saline to rinse out clots, then spray the nose 3 - 4 times with Afrin Nasal Decongestant Spray.  The spray will constrict the blood vessels to slow bleeding.  Pinch the lower half of your nose shut to apply pressure, and lay down with your head elevated.  Ice packs over the nose may help as well. If bleeding persists despite these measures, you should notify your doctor.  Do not use the Afrin routinely to control nasal congestion after surgery, as it can result in worsening congestion and may affect healing.     Activity: Return to work varies among patients. Most patients will be out   of work at least 5 - 7 days to recover.  Patient may return to work after they are off of narcotic pain medication, and feeling well enough to perform the functions of their job.  Patients must avoid heavy lifting (over 10 pounds) or strenuous physical for 2 weeks after surgery, so your employer may need to assign you to light duty, or keep you out of work longer if light duty is not possible.  NOTE: you should not drive, operate dangerous machinery, do any mentally demanding tasks or make any important legal or financial decisions while on narcotic pain medication and recovering from the general anesthetic.    Call Your Doctor Immediately if You Have Any of the Following: Bleeding that you cannot control with the above measures Loss of vision, double vision, bulging of the eye or black eyes. Fever over 101 degrees Neck stiffness with severe headache,  fever, nausea and change in mental state. You are always encouraged to call anytime with concerns, however, please call with requests for pain medication refills during office hours.  Office Endoscopy: During follow-up visits your doctor will remove any packing or splints that may have been placed and evaluate and clean your sinuses endoscopically.  Topical anesthetic will be used to make this as comfortable as possible, though you may want to take your pain medication prior to the visit.  How often this will need to be done varies from patient to patient.  After complete recovery from the surgery, you may need follow-up endoscopy from time to time, particularly if there is concern of recurrent infection or nasal polyps.  

## 2020-11-09 ENCOUNTER — Encounter: Payer: Self-pay | Admitting: Otolaryngology

## 2020-11-09 ENCOUNTER — Other Ambulatory Visit: Payer: Self-pay

## 2020-11-09 ENCOUNTER — Encounter
Admission: RE | Disposition: A | Discharge status: Home / Self Care | Payer: Self-pay | Source: Home / Self Care | Attending: Otolaryngology

## 2020-11-09 ENCOUNTER — Ambulatory Visit: Payer: Medicare Other | Admitting: Anesthesiology

## 2020-11-09 ENCOUNTER — Ambulatory Visit
Admission: RE | Admit: 2020-11-09 | Discharge: 2020-11-09 | Disposition: A | Discharge status: Home / Self Care | Payer: Medicare Other | Attending: Otolaryngology | Admitting: Otolaryngology

## 2020-11-09 DIAGNOSIS — Z6831 Body mass index (BMI) 31.0-31.9, adult: Secondary | ICD-10-CM | POA: Insufficient documentation

## 2020-11-09 DIAGNOSIS — J343 Hypertrophy of nasal turbinates: Secondary | ICD-10-CM | POA: Insufficient documentation

## 2020-11-09 DIAGNOSIS — J45909 Unspecified asthma, uncomplicated: Secondary | ICD-10-CM | POA: Insufficient documentation

## 2020-11-09 DIAGNOSIS — J342 Deviated nasal septum: Secondary | ICD-10-CM | POA: Diagnosis not present

## 2020-11-09 DIAGNOSIS — I10 Essential (primary) hypertension: Secondary | ICD-10-CM | POA: Diagnosis not present

## 2020-11-09 DIAGNOSIS — K219 Gastro-esophageal reflux disease without esophagitis: Secondary | ICD-10-CM | POA: Insufficient documentation

## 2020-11-09 DIAGNOSIS — E669 Obesity, unspecified: Secondary | ICD-10-CM | POA: Insufficient documentation

## 2020-11-09 DIAGNOSIS — E785 Hyperlipidemia, unspecified: Secondary | ICD-10-CM | POA: Diagnosis not present

## 2020-11-09 HISTORY — PX: NASAL SEPTOPLASTY W/ TURBINOPLASTY: SHX2070

## 2020-11-09 HISTORY — DX: Presence of external hearing-aid: Z97.4

## 2020-11-09 HISTORY — DX: Gastro-esophageal reflux disease without esophagitis: K21.9

## 2020-11-09 SURGERY — SEPTOPLASTY, NOSE, WITH NASAL TURBINATE REDUCTION
Anesthesia: General | Site: Nose | Laterality: Bilateral

## 2020-11-09 MED ORDER — EPHEDRINE SULFATE 50 MG/ML IJ SOLN
INTRAMUSCULAR | Status: DC | PRN
  Administered 2020-11-09: 10 mg via INTRAVENOUS

## 2020-11-09 MED ORDER — LACTATED RINGERS IV SOLN: INTRAVENOUS | Status: DC

## 2020-11-09 MED ORDER — OXYCODONE HCL 5 MG PO TABS: 5.0000 mg | ORAL_TABLET | Freq: Once | ORAL | Status: DC | PRN

## 2020-11-09 MED ORDER — SUCCINYLCHOLINE CHLORIDE 200 MG/10ML IV SOSY
PREFILLED_SYRINGE | INTRAVENOUS | Status: DC | PRN
  Administered 2020-11-09: 100 mg via INTRAVENOUS

## 2020-11-09 MED ORDER — ONDANSETRON HCL 4 MG/2ML IJ SOLN
INTRAMUSCULAR | Status: DC | PRN
  Administered 2020-11-09: 4 mg via INTRAVENOUS

## 2020-11-09 MED ORDER — DEXTROSE 5 % IV SOLN
600.0000 mg | Freq: Once | INTRAVENOUS | Status: AC
  Administered 2020-11-09: 600 mg via INTRAVENOUS

## 2020-11-09 MED ORDER — ACETAMINOPHEN 10 MG/ML IV SOLN
1000.0000 mg | Freq: Once | INTRAVENOUS | Status: AC
  Administered 2020-11-09: 1000 mg via INTRAVENOUS

## 2020-11-09 MED ORDER — PREDNISONE 10 MG PO TABS: ORAL_TABLET | ORAL | 0 refills | Status: DC

## 2020-11-09 MED ORDER — OXYMETAZOLINE HCL 0.05 % NA SOLN
2.0000 | Freq: Once | NASAL | Status: AC
  Administered 2020-11-09: 2 via NASAL

## 2020-11-09 MED ORDER — DEXAMETHASONE SODIUM PHOSPHATE 4 MG/ML IJ SOLN
INTRAMUSCULAR | Status: DC | PRN
  Administered 2020-11-09: 8 mg via INTRAVENOUS

## 2020-11-09 MED ORDER — OXYCODONE HCL 5 MG/5ML PO SOLN: 5.0000 mg | Freq: Once | ORAL | Status: DC | PRN

## 2020-11-09 MED ORDER — CLINDAMYCIN HCL 300 MG PO CAPS: 300.0000 mg | ORAL_CAPSULE | Freq: Three times a day (TID) | ORAL | 0 refills | Status: AC

## 2020-11-09 MED ORDER — LIDOCAINE HCL (CARDIAC) PF 100 MG/5ML IV SOSY
PREFILLED_SYRINGE | INTRAVENOUS | Status: DC | PRN
Start: 2020-11-09 — End: 2020-11-09
  Administered 2020-11-09: 30 mg via INTRAVENOUS

## 2020-11-09 MED ORDER — LABETALOL HCL 5 MG/ML IV SOLN
INTRAVENOUS | Status: DC | PRN
  Administered 2020-11-09: 5 mg via INTRAVENOUS

## 2020-11-09 MED ORDER — LIDOCAINE-EPINEPHRINE 1 %-1:100000 IJ SOLN
INTRAMUSCULAR | Status: DC | PRN
  Administered 2020-11-09: 6 mL

## 2020-11-09 MED ORDER — MIDAZOLAM HCL 5 MG/5ML IJ SOLN
INTRAMUSCULAR | Status: DC | PRN
  Administered 2020-11-09: 1 mg via INTRAVENOUS

## 2020-11-09 MED ORDER — PHENYLEPHRINE HCL 0.5 % NA SOLN
NASAL | Status: DC | PRN
  Administered 2020-11-09: 15 mL via TOPICAL

## 2020-11-09 MED ORDER — FENTANYL CITRATE PF 50 MCG/ML IJ SOSY: 25.0000 ug | PREFILLED_SYRINGE | INTRAMUSCULAR | Status: DC | PRN

## 2020-11-09 MED ORDER — GLYCOPYRROLATE 0.2 MG/ML IJ SOLN
INTRAMUSCULAR | Status: DC | PRN
  Administered 2020-11-09: .1 mg via INTRAVENOUS

## 2020-11-09 MED ORDER — PROPOFOL 10 MG/ML IV BOLUS
INTRAVENOUS | Status: DC | PRN
  Administered 2020-11-09: 150 mg via INTRAVENOUS

## 2020-11-09 MED ORDER — FENTANYL CITRATE (PF) 100 MCG/2ML IJ SOLN
INTRAMUSCULAR | Status: DC | PRN
  Administered 2020-11-09: 25 ug via INTRAVENOUS
  Administered 2020-11-09: 50 ug via INTRAVENOUS
  Administered 2020-11-09: 25 ug via INTRAVENOUS

## 2020-11-09 SURGICAL SUPPLY — 28 items
CANISTER SUCT 1200ML W/VALVE (MISCELLANEOUS) ×2 IMPLANT
COAGULATOR SUCT 8FR VV (MISCELLANEOUS) ×2 IMPLANT
ELECT REM PT RETURN 9FT ADLT (ELECTROSURGICAL) ×2
ELECTRODE REM PT RTRN 9FT ADLT (ELECTROSURGICAL) ×1 IMPLANT
GAUZE 4X4 16PLY ~~LOC~~+RFID DBL (SPONGE) ×1 IMPLANT
GLOVE SURG GAMMEX PI TX LF 7.5 (GLOVE) ×4 IMPLANT
GOWN STRL REUS W/ TWL LRG LVL3 (GOWN DISPOSABLE) ×1 IMPLANT
GOWN STRL REUS W/TWL LRG LVL3 (GOWN DISPOSABLE) ×2
KIT TURNOVER KIT A (KITS) ×2 IMPLANT
NDL ANESTHESIA 27G X 3.5 (NEEDLE) ×1 IMPLANT
NDL HYPO 27GX1-1/4 (NEEDLE) ×1 IMPLANT
NEEDLE ANESTHESIA  27G X 3.5 (NEEDLE) ×2
NEEDLE ANESTHESIA 27G X 3.5 (NEEDLE) ×1 IMPLANT
NEEDLE HYPO 27GX1-1/4 (NEEDLE) ×2 IMPLANT
PACK ENT CUSTOM (PACKS) ×2 IMPLANT
PATTIES SURGICAL .5 X3 (DISPOSABLE) ×2 IMPLANT
SOL ANTI-FOG 6CC FOG-OUT (MISCELLANEOUS) ×1 IMPLANT
SOL FOG-OUT ANTI-FOG 6CC (MISCELLANEOUS) ×1
SPLINT NASAL SEPTAL BLV .50 ST (MISCELLANEOUS) ×2 IMPLANT
STRAP BODY AND KNEE 60X3 (MISCELLANEOUS) ×2 IMPLANT
SUT CHROMIC 3-0 (SUTURE) ×2
SUT CHROMIC 3-0 KS 27XMFL CR (SUTURE) ×1
SUT ETHILON 3-0 KS 30 BLK (SUTURE) ×2 IMPLANT
SUT PLAIN GUT 4-0 (SUTURE) ×2 IMPLANT
SUTURE CHRMC 3-0 KS 27XMFL CR (SUTURE) ×1 IMPLANT
SYR 3ML LL SCALE MARK (SYRINGE) ×2 IMPLANT
TOWEL OR 17X26 4PK STRL BLUE (TOWEL DISPOSABLE) ×2 IMPLANT
WATER STERILE IRR 250ML POUR (IV SOLUTION) ×2 IMPLANT

## 2020-11-09 NOTE — Op Note (Signed)
11/09/2020  11:14 AM  [id number]   Pre-Op Dx:  Deviated Nasal Septum, Hypertrophic Inferior Turbinates  Post-op Dx: Same  Proc: Nasal Septoplasty, Bilateral Partial Reduction Inferior Turbinates   Surg:  Andrea Fuentes Andrea Fuentes  Anes:  GOT  EBL: 100 mL  Comp: None  Findings: Patient had extremely deviated septum to the left side was blocking the airway here.  She had enlarged inferior turbinates as well.  The patient's been on aspirin therapy and I am not sure she stopped this because she had some slight oozing throughout the case and did not have the typical clots forming in the nasopharynx as usual.  Procedure: With the patient in a comfortable supine position,  general orotracheal anesthesia was induced without difficulty.     The patient received preoperative Afrin spray for topical decongestion and vasoconstriction.  Intravenous prophylactic antibiotics were administered.  At an appropriate level, the patient was placed in a semi-sitting position.  Nasal vibrissae were trimmed.   1% Xylocaine with 1:100,000 epinephrine, 6 cc's, was infiltrated into the anterior floor of the nose, into the nasal spine region, into the membranous columella, and finally into the submucoperichondrial plane of the septum on both sides.  Several minutes were allowed for this to take effect.  Cottoniod pledgetts soaked in Afrin and 4% Xylocaine were placed into both nasal cavities and left while the patient was prepped and draped in the standard fashion.  The materials were removed from the nose and observed to be intact and correct in number.  The nose was inspected with a headlight and zero degree scope with the findings as described above.  A left Killian incision was sharply executed and carried down to the quadrangular cartilage. The mucoperichondrium was elelvated along the quadrangular plate back to the bony-cartilaginous junction. The mucoperiostium was then elevated along the ethmoid plate and the vomer.  The boney-catilaginous junction was then split with a freer elevator and the mucoperiosteum was elevated on the opposite side. The mucoperiosteum was then elevated along the maxillary crest as needed to expose the crooked bone of the crest.  Boney spurs of the vomer and maxillary crest were removed with Donavan Foil forceps.  The cartilaginous plate was trimmed along its posterior and inferior borders of about 2 mm of cartilage to free it up inferiorly. Some of the deviated ethmoid plate was then fractured and removed with Takahashi forceps to free up the posterior border of the quadrangular plate and allow it to swing back to the midline. The mucosal flaps were placed back into their anatomic position to allow visualization of the airways. The septum now sat in the midline with an improved airway.  A 3-0 Chromic suture on a Keith needle in used to anchor the inferior septum at the nasal spine with a through and through suture. The mucosal flaps are then sutured together using a through and through whip stitch of 4-0 Plain Gut with a mini-Keith needle. This was used to close the Ordway incision as well.   The inferior turbinates were then inspected. An incision was created along the inferior aspect of the left inferior turbinate with removal of some of the inferior soft tissue and bone. Electrocautery was used to control bleeding in the area. The remaining turbinate was then outfractured to open up the airway further. There was no significant bleeding noted. The right turbinate was then trimmed and outfractured in a similar fashion.  The airways were then visualized and showed open passageways on both sides that were significantly improved  compared to before surgery. There was no signifcant bleeding. Nasal splints were applied to both sides of the septum using Xomed 0.34mm regular sized splints that were trimmed, and then held in position with a 3-0 Nylon through and through suture.  The patient tolerated the  procedure well.  The patient was turned back over to anesthesia, and awakened, extubated, and taken to the PACU in satisfactory condition.  Dispo:   PACU to home  Plan: Ice, elevation, narcotic analgesia, steroid taper, and prophylactic antibiotics for the duration of indwelling nasal foreign bodies.  We will reevaluate the patient in the office in 6 days and remove the septal splints.  Return to work in 10 days, strenuous activities in two weeks.   Andrea Fuentes Andrea Fuentes 11/09/2020 11:14 AM

## 2020-11-09 NOTE — H&P (Signed)
H&P has been reviewed and patient reevaluated, no changes necessary. To be downloaded later.  

## 2020-11-09 NOTE — Anesthesia Postprocedure Evaluation (Signed)
Anesthesia Post Note  Patient: Andrea Fuentes  Procedure(s) Performed: NASAL SEPTOPLASTY WITH INFERIOR TURBINATE REDUCTION (Bilateral: Nose)     Patient location during evaluation: PACU Anesthesia Type: General Level of consciousness: awake Pain management: pain level controlled Vital Signs Assessment: post-procedure vital signs reviewed and stable Respiratory status: respiratory function stable Cardiovascular status: stable Postop Assessment: no signs of nausea or vomiting Anesthetic complications: no   No notable events documented.  Veda Canning

## 2020-11-09 NOTE — Transfer of Care (Signed)
Immediate Anesthesia Transfer of Care Note  Patient: Andrea Fuentes  Procedure(s) Performed: NASAL SEPTOPLASTY WITH INFERIOR TURBINATE REDUCTION (Bilateral: Nose)  Patient Location: PACU  Anesthesia Type: General  Level of Consciousness: awake, alert  and patient cooperative  Airway and Oxygen Therapy: Patient Spontanous Breathing and Patient connected to supplemental oxygen  Post-op Assessment: Post-op Vital signs reviewed, Patient's Cardiovascular Status Stable, Respiratory Function Stable, Patent Airway and No signs of Nausea or vomiting  Post-op Vital Signs: Reviewed and stable  Complications: No notable events documented.

## 2020-11-09 NOTE — Anesthesia Procedure Notes (Signed)
Procedure Name: Intubation Date/Time: 11/09/2020 10:03 AM Performed by: Mayme Genta, CRNA Pre-anesthesia Checklist: Patient identified, Emergency Drugs available, Suction available, Patient being monitored and Timeout performed Patient Re-evaluated:Patient Re-evaluated prior to induction Oxygen Delivery Method: Circle system utilized Preoxygenation: Pre-oxygenation with 100% oxygen Induction Type: IV induction Ventilation: Mask ventilation without difficulty Laryngoscope Size: Miller and 2 Grade View: Grade I Tube type: Oral Rae Tube size: 7.0 mm Number of attempts: 1 Placement Confirmation: ETT inserted through vocal cords under direct vision, positive ETCO2 and breath sounds checked- equal and bilateral Tube secured with: Tape Dental Injury: Teeth and Oropharynx as per pre-operative assessment

## 2020-11-09 NOTE — Anesthesia Preprocedure Evaluation (Signed)
Anesthesia Evaluation  Patient identified by MRN, date of birth, ID band Patient awake    Reviewed: Allergy & Precautions, NPO status   Airway Mallampati: II  TM Distance: >3 FB     Dental   Pulmonary    Pulmonary exam normal        Cardiovascular hypertension, + Peripheral Vascular Disease   Rhythm:Regular Rate:Normal  HLD   Neuro/Psych  Headaches, PSYCHIATRIC DISORDERS Anxiety Depression CVA, No Residual Symptoms    GI/Hepatic GERD  ,  Endo/Other  BMI 32  Renal/GU      Musculoskeletal   Abdominal   Peds  Hematology   Anesthesia Other Findings   Reproductive/Obstetrics                             Anesthesia Physical Anesthesia Plan  ASA: 3  Anesthesia Plan: General   Post-op Pain Management:    Induction: Intravenous  PONV Risk Score and Plan: Ondansetron, Dexamethasone, Midazolam and Treatment may vary due to age or medical condition  Airway Management Planned: Oral ETT  Additional Equipment:   Intra-op Plan:   Post-operative Plan:   Informed Consent: I have reviewed the patients History and Physical, chart, labs and discussed the procedure including the risks, benefits and alternatives for the proposed anesthesia with the patient or authorized representative who has indicated his/her understanding and acceptance.     Dental advisory given  Plan Discussed with: CRNA  Anesthesia Plan Comments:         Anesthesia Quick Evaluation

## 2020-11-10 ENCOUNTER — Encounter: Payer: Self-pay | Admitting: Otolaryngology

## 2021-03-23 ENCOUNTER — Other Ambulatory Visit: Payer: Self-pay | Admitting: Internal Medicine

## 2021-03-23 DIAGNOSIS — Z1231 Encounter for screening mammogram for malignant neoplasm of breast: Secondary | ICD-10-CM

## 2021-05-21 ENCOUNTER — Ambulatory Visit
Admission: RE | Admit: 2021-05-21 | Discharge: 2021-05-21 | Disposition: A | Discharge status: Home / Self Care | Payer: Medicare Other | Source: Ambulatory Visit | Attending: Internal Medicine | Admitting: Internal Medicine

## 2021-05-21 DIAGNOSIS — Z1231 Encounter for screening mammogram for malignant neoplasm of breast: Secondary | ICD-10-CM

## 2021-11-01 ENCOUNTER — Institutional Professional Consult (permissible substitution): Payer: Medicare Other | Admitting: Plastic Surgery

## 2021-11-09 ENCOUNTER — Ambulatory Visit: Payer: Medicare Other | Admitting: Plastic Surgery

## 2021-11-09 ENCOUNTER — Encounter: Payer: Self-pay | Admitting: Plastic Surgery

## 2021-11-09 VITALS — BP 176/77 | HR 84 | Ht 63.0 in | Wt 170.4 lb

## 2021-11-09 DIAGNOSIS — R21 Rash and other nonspecific skin eruption: Secondary | ICD-10-CM

## 2021-11-09 DIAGNOSIS — N62 Hypertrophy of breast: Secondary | ICD-10-CM | POA: Diagnosis not present

## 2021-11-09 DIAGNOSIS — M542 Cervicalgia: Secondary | ICD-10-CM

## 2021-11-09 NOTE — Progress Notes (Signed)
Referring Provider Kirk Ruths, MD [facility] [address],  [place] [address]   CC:  Chief Complaint  Patient presents with   Consult           Andrea Fuentes is an 71 y.o. female.  HPI: This 71 year old female presents for evaluation for possible breast reduction.  She relates a history of very large breasts which have caused her both embarrassment and discomfort since she was a teenager.  Specifically she notes pain in her neck which radiates down her arms pain where her bra straps pull against her shoulders.  She also requires the use of deodorant on the posterior aspect of the breast due to sweating and during the summer this causes rashes and discomfort.  Allergies  Allergen Reactions   Penicillin G Swelling    lips   Nsaids Other (See Comments)    Gave a stomach ulcer   Shellfish Allergy Itching    Scallops only - Mouth itching   Iodine Rash    IV dye/ burning at site   Penicillin V Potassium Swelling and Rash    Outpatient Encounter Medications as of 11/09/2021  Medication Sig   acetaminophen (TYLENOL) 650 MG CR tablet Take 650 mg by mouth every 8 (eight) hours as needed for pain.   albuterol (PROVENTIL HFA;VENTOLIN HFA) 108 (90 Base) MCG/ACT inhaler Inhale 2 puffs into the lungs every 6 (six) hours as needed for wheezing or shortness of breath.   atorvastatin (LIPITOR) 80 MG tablet Take 80 mg by mouth daily. pm   fluticasone (FLONASE) 50 MCG/ACT nasal spray Place 1 spray into both nostrils 2 (two) times daily. Am and pm   gabapentin (NEURONTIN) 300 MG capsule Take 600 mg by mouth 2 (two) times daily. 1 tab AM, 3 tabs PM   lisinopril (PRINIVIL,ZESTRIL) 10 MG tablet Take 10 mg by mouth daily. pm   Melatonin 10 MG CAPS Take by mouth daily. pm   Multiple Vitamins-Minerals (CENTRUM SILVER 50+WOMEN) TABS Take by mouth daily. am   omeprazole (PRILOSEC) 20 MG capsule Take 20 mg by mouth daily. pm   polyethylene glycol (MIRALAX /  GLYCOLAX) 17 g packet Take 17 g by mouth every other day.   traMADol (ULTRAM) 50 MG tablet Take 50 mg by mouth 4 (four) times daily. Am,noon,dinner,hs   VITAMIN D PO Take by mouth daily.   venlafaxine (EFFEXOR) 75 MG tablet Take 75 mg by mouth 2 (two) times daily. am   [DISCONTINUED] Cyanocobalamin (VITAMIN B-12 PO) Take by mouth daily.   [DISCONTINUED] diphenhydrAMINE (BENADRYL) 25 mg capsule Take 25 mg by mouth every 6 (six) hours as needed.   [DISCONTINUED] fexofenadine (ALLEGRA) 180 MG tablet Take 180 mg by mouth daily. am   [DISCONTINUED] predniSONE (DELTASONE) 10 MG tablet Start with 3 pills tomorrow. Taper over the next 6 days.  3,3,2,2,1,1.   No facility-administered encounter medications on file as of 11/09/2021.     Past Medical History:  Diagnosis Date   Anxiety    Asthma    no issue for 8 years   Cancer ([place])    squamous cell ca rt forearm   DDD (degenerative disc disease), lumbar    osteo and psoriatic/ hips,legs,knees and shins, finger   Deafness in left ear    Depression    Endometriosis    GERD (gastroesophageal reflux disease)    Hammertoe    right foot   Headache    migraines/ 1 or 2 a year,  migraines worse before hysterectomy   History of esophagogastroduodenoscopy (EGD)    HOH (hard of hearing)    bilateral/ aides   Hyperlipidemia    Hypertension    controlled on meds   Low back pain    Lumbar stenosis with neurogenic claudication    Neuromuscular disorder (HCC)    Obesity    Peripheral vascular disease ([place])    Psoriasis    hands and scalp   S/P ear surgery    Stomach ulcer due to nonsteroidal anti-inflammatory drug (NSAID)    Stroke ([place])    3 years ago/ no residual effects   Wears hearing aid in both ears     Past Surgical History:  Procedure Laterality Date   ABDOMINAL HYSTERECTOMY  1990   bilateral oopherectomy   bladder tack     CATARACT EXTRACTION Bilateral    COLONOSCOPY     COLONOSCOPY WITH PROPOFOL N/A 03/27/2015   Procedure:  COLONOSCOPY WITH PROPOFOL;  Surgeon: Hulen Luster, MD;  Location: [facility] ENDOSCOPY;  Service: Gastroenterology;  Laterality: N/A;   EXCISION PARTIAL PHALANX Right 07/26/2015   Procedure: EXCISION PARTIAL PHALANX RIGHT 3RD TOE;  Surgeon: [name], DPM;  Location: [place];  Service: Podiatry;  Laterality: Right;  WITH LOCAL   HAMMER TOE SURGERY Right 07/26/2015   Procedure: HAMMER TOE CORRECTION RIGHT 4TH TOE;  Surgeon: [name], DPM;  Location: [place];  Service: Podiatry;  Laterality: Right;   INNER EAR SURGERY Left    as a child/ ear drum   JOINT REPLACEMENT Bilateral    partial knee replacement   kidney stone removal     LIVER BIOPSY     NASAL SEPTOPLASTY W/ TURBINOPLASTY Bilateral 11/09/2020   Procedure: NASAL SEPTOPLASTY WITH INFERIOR TURBINATE REDUCTION;  Surgeon: Margaretha Sheffield, MD;  Location: [place];  Service: ENT;  Laterality: Bilateral;   TONSILLECTOMY      Family History  Problem Relation Age of Onset   Heart disease Mother    Heart attack Father    Heart disease Father    Alcohol abuse Brother    Migraines Daughter    Breast cancer Neg Hx     Social History   Social History Narrative   Not on file     Review of Systems General: Denies fevers, chills, weight loss CV: Denies chest pain, shortness of breath, palpitations Skin: The patient has a history of psoriatic arthritis and has used steroids in the past but states that she has not used any steroids in the past few years. Breast: Patient denies any specific breast problems other than the discomfort.  He had a mammogram in April 2023 which was a BI-RADS 1.  Physical Exam    11/09/2021    1:55 PM 11/09/2020   11:45 AM 11/09/2020   11:19 AM  Vitals with BMI  Height '5\' 3"'$     Weight 170 lbs 6 oz    BMI 44.01    Systolic 027    Diastolic 77    Pulse 84 75 71    General:  No acute distress,  Alert and oriented, Non-Toxic, Normal speech and affect Height 5 foot 3 inches  weight 170 pounds BMI 30.1 body surface area 1.85 Breast: The patient has very ptotic but not particularly heavy breasts bilaterally.  The left side is larger than the right.  Measurements sternal notch to nipple on the right 38 cm sternal notch to nipple on the left 41 cm nipple to fold 10 cm however  the nipple is on the posterior aspect of the breast.  The fold to the lowest point of the breast is 16 cm.  Assessment/Plan Symptomatic macromastia: Ms. Earleen Newport is requesting a breast reduction on the Schnur scale she would be required to have a 470 g reduction.   I feel that I can get 300 to 400 g possibly 450 g bilaterally.  We will submit the patient's chart and pictures for insurance approval approved she will return to the clinic for scheduling and will need preop medical clearance. Camillia Herter 11/09/2021, 3:14 PM

## 2021-11-30 ENCOUNTER — Telehealth: Payer: Self-pay | Admitting: *Deleted

## 2021-11-30 NOTE — Telephone Encounter (Signed)
Call from patient about sx. Notified will submit auth today and update her once auth received.

## 2021-12-03 ENCOUNTER — Telehealth: Payer: Self-pay | Admitting: *Deleted

## 2021-12-03 NOTE — Telephone Encounter (Signed)
Auth submitted for 2 units CPT [place]: [id number]

## 2022-01-10 ENCOUNTER — Ambulatory Visit (INDEPENDENT_AMBULATORY_CARE_PROVIDER_SITE_OTHER): Payer: Medicare Other | Admitting: Plastic Surgery

## 2022-01-10 VITALS — BP 179/82 | HR 88 | Temp 97.7°F | Resp 18 | Ht 63.0 in | Wt 171.0 lb

## 2022-01-10 DIAGNOSIS — N62 Hypertrophy of breast: Secondary | ICD-10-CM

## 2022-01-10 NOTE — Progress Notes (Signed)
Andrea Fuentes returns today for discussion of her breast reduction.  She has been approved by her insurance company and will likely be scheduled for the middle part of January.  I had an extensive discussion with the patient and her husband regarding bilateral breast reductions.  Again we discussed the location of the incisions.  Specifically in her case I discussed the potential difficulty with repositioning the nipple.  Her nipple currently lies on the posterior aspect of her breast.  I admitted to her that I had not seen this nipple position before and that it will be a challenge to move the nipple back up to the anterior surface of the breast.  In any other circumstance I would be worried that this positioning was due to an underlying malignancy however her mammogram in April of this year was a BI-RADS 1 with no evidence of malignancy and her physical exam is unremarkable.  I showed Andrea Fuentes and her husband exactly how the surgery is done with the use of inferior pedicle and how this might benefit the proper positioning of her nipple.  We also discussed the risks of bleeding infection and scar formation.  She understands that she will have a drain and she will need to return to the clinic the following morning to have the drain removed.  Will proceed with breast reduction at her request.

## 2022-02-13 ENCOUNTER — Ambulatory Visit (INDEPENDENT_AMBULATORY_CARE_PROVIDER_SITE_OTHER): Payer: Medicare Other | Admitting: Physician Assistant

## 2022-02-13 ENCOUNTER — Encounter: Payer: Self-pay | Admitting: Physician Assistant

## 2022-02-13 VITALS — BP 176/81 | HR 109 | Ht 63.0 in | Wt 164.0 lb

## 2022-02-13 DIAGNOSIS — N62 Hypertrophy of breast: Secondary | ICD-10-CM

## 2022-02-13 MED ORDER — ONDANSETRON 4 MG PO TBDP: 4.0000 mg | ORAL_TABLET | Freq: Three times a day (TID) | ORAL | 0 refills | Status: DC | PRN

## 2022-02-13 NOTE — Progress Notes (Signed)
Patient ID: Andrea Fuentes, female    DOB: Sep 30, 1950, 72 y.o.   MRN: [id number]  Chief Complaint  Patient presents with   Pre-op Exam      ICD-10-CM   1. Macromastia  N62        History of Present Illness: Andrea Fuentes is a 72 y.o.  female  with a history of macromastia.  She presents for preoperative evaluation for upcoming procedure, bilateral breast reduction, scheduled for 03/01/2022 with Dr.  Lovena Fuentes .  The patient has not had problems with anesthesia.  She is accompanied by her husband at bedside.  She reports that she had a stroke a little over a decade ago for which she takes 81 mg aspirin daily as directed by her primary care provider.  She denies any cardiac history and states that she had negative carotid artery duplex and no history of A-fib as contributing etiology.  She does not smoke or use any nicotine-containing products.  Denies any pulmonary history aside from mild asthma which is well-controlled, no requirement of albuterol inhaler in over a year.  She confirms that she is a 46 F cup and would like to be a D cup postoperatively.  She understands risks and benefits of the surgery.  Plan to hold her aspirin for 1 week in addition to her vitamin D3 and multivitamin.  She tells me that she is hard of hearing and also has frequent urination for which she is requesting catheter at time of surgery given length on the table.  Spider veins but no varicosities.  No family history of breast cancer.  No personal history of cancer, DVT, or severe cardiac/pulmonary disease.  Summary of Previous Visit: She was seen for initial consult by Dr. Lovena Fuentes on 11/09/2021.  At that time, complained of chronic upper back and neck discomfort in the context of large breasts.  Normal mammogram 05/2021.  STN was noted to be 38 cm on the right, 41 cm on the left.  Left breast was noted to be larger.  Estimated excess breast tissue removed at time of surgery equals 300 to 450 g each side.  She  was told that preoperative clearance would be required.  Job: Retired.  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   Past Medical History: Allergies: Allergies  Allergen Reactions   Penicillin G Swelling    lips   Nsaids Other (See Comments)    Gave a stomach ulcer   Shellfish Allergy Itching    Scallops only - Mouth itching   Iodine Rash    IV dye/ burning at site   Penicillin V Potassium Swelling and Rash    Current Medications:  Current Outpatient Medications:    acetaminophen (TYLENOL) 650 MG CR tablet, Take 650 mg by mouth every 8 (eight) hours as needed for pain., Disp: , Rfl:    albuterol (PROVENTIL HFA;VENTOLIN HFA) 108 (90 Base) MCG/ACT inhaler, Inhale 2 puffs into the lungs every 6 (six) hours as needed for wheezing or shortness of breath., Disp: , Rfl:    aspirin EC 81 MG tablet, Take 81 mg by mouth daily. Swallow whole., Disp: , Rfl:    atorvastatin (LIPITOR) 80 MG tablet, Take 80 mg by mouth daily. pm, Disp: , Rfl:    busPIRone (BUSPAR) 15  MG tablet, Take 1 tablet by mouth 2 (two) times daily., Disp: , Rfl:    gabapentin (NEURONTIN) 300 MG capsule, Take 600 mg by mouth 2 (two) times daily. 1 tab AM, 3 tabs PM, Disp: , Rfl:    lisinopril (PRINIVIL,ZESTRIL) 10 MG tablet, Take 10 mg by mouth daily. pm, Disp: , Rfl:    Melatonin 10 MG CAPS, Take by mouth daily. pm, Disp: , Rfl:    Multiple Vitamins-Minerals (CENTRUM SILVER 50+WOMEN) TABS, Take by mouth daily. am, Disp: , Rfl:    omeprazole (PRILOSEC) 20 MG capsule, Take 20 mg by mouth daily. pm, Disp: , Rfl:    ondansetron (ZOFRAN-ODT) 4 MG disintegrating tablet, Take 1 tablet (4 mg total) by mouth every 8 (eight) hours as needed for nausea or vomiting., Disp: 20 tablet, Rfl: 0   polyethylene glycol (MIRALAX /  GLYCOLAX) 17 g packet, Take 17 g by mouth every other day., Disp: , Rfl:    traMADol (ULTRAM) 50 MG tablet, Take 50 mg by mouth 4 (four) times daily. Am,noon,dinner,hs, Disp: , Rfl:    triamcinolone (NASACORT) 55 MCG/ACT AERO nasal inhaler, Place 1 spray into the nose daily., Disp: , Rfl:    venlafaxine (EFFEXOR) 75 MG tablet, Take 75 mg by mouth 2 (two) times daily. am, Disp: , Rfl:    VITAMIN D PO, Take by mouth daily., Disp: , Rfl:   Past Medical Problems: Past Medical History:  Diagnosis Date   Anxiety    Asthma    no issue for 8 years   Cancer ([place])    squamous cell ca rt forearm   DDD (degenerative disc disease), lumbar    osteo and psoriatic/ hips,legs,knees and shins, finger   Deafness in left ear    Depression    Endometriosis    GERD (gastroesophageal reflux disease)    Hammertoe    right foot   Headache    migraines/ 1 or 2 a year, migraines worse before hysterectomy   History of esophagogastroduodenoscopy (EGD)    HOH (hard of hearing)    bilateral/ aides   Hyperlipidemia    Hypertension    controlled on meds   Low back pain    Lumbar stenosis with neurogenic claudication    Neuromuscular disorder (HCC)    Obesity    Peripheral vascular disease ([place])    Psoriasis    hands and scalp   S/P ear surgery    Stomach ulcer due to nonsteroidal anti-inflammatory drug (NSAID)    Stroke ([place])    3 years ago/ no residual effects   Wears hearing aid in both ears     Past Surgical History: Past Surgical History:  Procedure Laterality Date   ABDOMINAL HYSTERECTOMY  1990   bilateral oopherectomy   bladder tack     CATARACT EXTRACTION Bilateral    COLONOSCOPY     COLONOSCOPY WITH PROPOFOL N/A 03/27/2015   Procedure: COLONOSCOPY WITH PROPOFOL;  Surgeon: Hulen Luster, MD;  Location: [facility] ENDOSCOPY;  Service: Gastroenterology;  Laterality: N/A;   EXCISION PARTIAL PHALANX Right 07/26/2015   Procedure: EXCISION PARTIAL PHALANX RIGHT 3RD TOE;  Surgeon: [name], DPM;   Location: [place];  Service: Podiatry;  Laterality: Right;  WITH LOCAL   HAMMER TOE SURGERY Right 07/26/2015   Procedure: HAMMER TOE CORRECTION RIGHT 4TH TOE;  Surgeon: [name], DPM;  Location: [place];  Service: Podiatry;  Laterality: Right;   INNER EAR SURGERY Left    as a child/ ear drum  JOINT REPLACEMENT Bilateral    partial knee replacement   kidney stone removal     LIVER BIOPSY     NASAL SEPTOPLASTY W/ TURBINOPLASTY Bilateral 11/09/2020   Procedure: NASAL SEPTOPLASTY WITH INFERIOR TURBINATE REDUCTION;  Surgeon: Margaretha Sheffield, MD;  Location: [place];  Service: ENT;  Laterality: Bilateral;   TONSILLECTOMY      Social History: Social History   Socioeconomic History   Marital status: Married    Spouse name: Not on file   Number of children: Not on file   Years of education: Not on file   Highest education level: Not on file  Occupational History   Not on file  Tobacco Use   Smoking status: Never   Smokeless tobacco: Never  Vaping Use   Vaping Use: Never used  Substance and Sexual Activity   Alcohol use: No   Drug use: No   Sexual activity: Not on file  Other Topics Concern   Not on file  Social History Narrative   Not on file   Social Determinants of Health   Financial Resource Strain: Not on file  Food Insecurity: Not on file  Transportation Needs: Not on file  Physical Activity: Not on file  Stress: Not on file  Social Connections: Not on file  Intimate Partner Violence: Not on file    Family History: Family History  Problem Relation Age of Onset   Heart disease Mother    Heart attack Father    Heart disease Father    Alcohol abuse Brother    Migraines Daughter    Breast cancer Neg Hx     Review of Systems: ROS Treated for sinus infection recently, but denies any recent fevers, chest pain, difficulty breathing, or leg swelling.  Physical Exam: Vital Signs BP (!) 176/81 (BP Location: Left Arm, Patient  Position: Sitting, Cuff Size: Normal)   Pulse (!) 109   Ht '5\' 3"'$  (1.6 m)   Wt 164 lb (74.4 kg)   SpO2 95%   BMI 29.05 kg/m   Physical Exam Constitutional:      General: Not in acute distress.    Appearance: Normal appearance. Not ill-appearing.  HENT:     Head: Normocephalic and atraumatic.  Eyes:     Pupils: Pupils are equal, round. Cardiovascular:     Rate and Rhythm: Normal rate.    Pulses: Normal pulses.  Pulmonary:     Effort: No respiratory distress or increased work of breathing.  Speaks in full sentences. Abdominal:     General: Abdomen is flat. No distension.   Musculoskeletal: Normal range of motion. No lower extremity swelling or edema.  Spider veins but no varicosities. Skin:    General: Skin is warm and dry.     Findings: No erythema or rash.  Neurological:     Mental Status: Alert and oriented to person, place, and time.  Psychiatric:        Mood and Affect: Mood normal.        Behavior: Behavior normal.    Assessment/Plan: The patient is scheduled for bilateral breast reduction with Dr.  Lovena Fuentes .  Risks, benefits, and alternatives of procedure discussed, questions answered and consent obtained.    Smoking Status: Non-smoker, no nicotine-containing products. Last Mammogram: 05/2021; Results: BI-RADS Category 1: Negative.  Caprini Score: 10; Risk Factors include: Age, BMI greater than 25, history of stroke, and length of planned surgery. Recommendation for mechanical and likely pharmacological prophylaxis. Discussed case with Dr. Lovena Fuentes who will speak with  patient again and decide on whether or not post-operative Lovenox is indicated.  Encourage early ambulation.   Pictures obtained: 11/09/2021.  Post-op Rx sent to pharmacy: Zofran.  She is already on gabapentin and tramadol for pain control and given age and fall risk, engaged in shared decision making to not prescribe any additional oxycodone.  Patient was provided with the General Surgical Risk consent  document and Pain Medication Agreement prior to their appointment.  They had adequate time to read through the risk consent documents and Pain Medication Agreement. We also discussed them in person together during this preop appointment. All of their questions were answered to their satisfaction.  Recommended calling if they have any further questions.  Risk consent form and Pain Medication Agreement to be scanned into patient's chart.  The risk that can be encountered with breast reduction were discussed and include the following but not limited to these:  Breast asymmetry, fluid accumulation, firmness of the breast, inability to breast feed, loss of nipple or areola, skin loss, decrease or no nipple sensation, fat necrosis of the breast tissue, bleeding, infection, healing delay.  There are risks of anesthesia, changes to skin sensation and injury to nerves or blood vessels.  The muscle can be temporarily or permanently injured.  You may have an allergic reaction to tape, suture, glue, blood products which can result in skin discoloration, swelling, pain, skin lesions, poor healing.  Any of these can lead to the need for revisonal surgery or stage procedures.  A reduction has potential to interfere with diagnostic procedures.  Nipple or breast piercing can increase risks of infection.  This procedure is best done when the breast is fully developed.  Changes in the breast will continue to occur over time.  Pregnancy can alter the outcomes of previous breast reduction surgery, weight gain and weigh loss can also effect the long term appearance.    Electronically signed by: Krista Blue, PA-C 02/13/2022 3:53 PM

## 2022-02-19 ENCOUNTER — Ambulatory Visit (INDEPENDENT_AMBULATORY_CARE_PROVIDER_SITE_OTHER): Payer: Medicare Other | Admitting: Plastic Surgery

## 2022-02-19 ENCOUNTER — Encounter: Payer: Self-pay | Admitting: Plastic Surgery

## 2022-02-19 DIAGNOSIS — N62 Hypertrophy of breast: Secondary | ICD-10-CM

## 2022-02-19 NOTE — Progress Notes (Signed)
I asked Ms. Cundy to return today to discuss her upcoming surgery.  I was already somewhat hesitant to offer her surgery 1 because I did not feel that she would derive a significant benefit from the surgery and 2 I felt that she was already somewhat medically fragile.  On her preoperative visit she was noted to have a Caprini score of 10 due to her age her weight and a previous history of stroke.  Given all of these factors on top of the fact that she would already be a difficult breast reduction I have elected to cancel her surgery and will not offer myself she has requested a second opinion by Dr. Marla Roe which we will arrange.

## 2022-02-22 ENCOUNTER — Institutional Professional Consult (permissible substitution): Payer: Medicare Other | Admitting: Plastic Surgery

## 2022-03-01 ENCOUNTER — Encounter (HOSPITAL_BASED_OUTPATIENT_CLINIC_OR_DEPARTMENT_OTHER): Payer: Self-pay

## 2022-03-01 ENCOUNTER — Ambulatory Visit (HOSPITAL_BASED_OUTPATIENT_CLINIC_OR_DEPARTMENT_OTHER): Admit: 2022-03-01 | Payer: Medicare Other | Admitting: Plastic Surgery

## 2022-03-01 SURGERY — MAMMOPLASTY, REDUCTION
Anesthesia: General | Site: Breast | Laterality: Bilateral

## 2022-03-04 ENCOUNTER — Encounter: Payer: Medicare Other | Admitting: Physician Assistant

## 2022-03-11 ENCOUNTER — Encounter: Payer: Medicare Other | Admitting: Plastic Surgery

## 2022-03-15 ENCOUNTER — Encounter: Payer: Self-pay | Admitting: Plastic Surgery

## 2022-03-15 ENCOUNTER — Ambulatory Visit (INDEPENDENT_AMBULATORY_CARE_PROVIDER_SITE_OTHER): Payer: Medicare Other | Admitting: Plastic Surgery

## 2022-03-15 VITALS — BP 133/75 | HR 93 | Ht 63.0 in | Wt 162.0 lb

## 2022-03-15 DIAGNOSIS — G8929 Other chronic pain: Secondary | ICD-10-CM

## 2022-03-15 DIAGNOSIS — M549 Dorsalgia, unspecified: Secondary | ICD-10-CM | POA: Insufficient documentation

## 2022-03-15 DIAGNOSIS — M546 Pain in thoracic spine: Secondary | ICD-10-CM

## 2022-03-15 DIAGNOSIS — M25512 Pain in left shoulder: Secondary | ICD-10-CM

## 2022-03-15 DIAGNOSIS — M542 Cervicalgia: Secondary | ICD-10-CM | POA: Diagnosis not present

## 2022-03-15 DIAGNOSIS — Z7982 Long term (current) use of aspirin: Secondary | ICD-10-CM

## 2022-03-15 DIAGNOSIS — N62 Hypertrophy of breast: Secondary | ICD-10-CM | POA: Diagnosis not present

## 2022-03-15 NOTE — Progress Notes (Signed)
Patient ID: Andrea Fuentes, female    DOB: 02-25-1950, 72 y.o.   MRN: [id number]   Chief Complaint  Patient presents with   Consult        Breast Problem    The patient is a 72 year old female here for evaluation of her breasts.  She is interested in bilateral breast reduction.  She was seen by Dr. Lovena Le and surgery was scheduled.  There were some hesitations and the surgery was canceled and patient requested a second opinion.  She also had a Caprini score of 10 so there was some concern about her risks for after surgery.  Her sternal notch to nipple distance is 40 on the right and 42 on the left.  Her nipple to inframammary fold distance is 17.  She is 5 feet 3 inches tall weighs 162 pounds.  Her preoperative bra size is likely a DDD.  She also complains of neck and back pain as well as an ache through her left shoulder and neck area.  She is on aspirin because of the stroke but no other blood thinner.  She is not on any medication for her psoriasis.  She says it cleared up when her stress went away which was from a job.  Her past medical history is positive for depression, cancer, anxiety, headaches, psoriasis, peripheral vascular disease, back pain, hyperlipidemia, hypertension, stroke and hearing difficulties.  The rest are listed below.  She is undergone an abdominal hysterectomy, toe surgery, knee replacements, tonsillectomy, cataract surgery and ear surgery as well as a liver biopsy and nasal surgery.     Review of Systems  Constitutional:  Positive for activity change. Negative for appetite change.  Eyes: Negative.   Respiratory: Negative.  Negative for chest tightness and shortness of breath.   Cardiovascular: Negative.   Gastrointestinal: Negative.   Endocrine: Negative.   Genitourinary: Negative.   Musculoskeletal:  Positive for back pain and neck pain.  Skin: Negative.   Neurological: Negative.   Hematological: Negative.     Past Medical History:  Diagnosis  Date   Anxiety    Asthma    no issue for 8 years   Cancer ([place])    squamous cell ca rt forearm   DDD (degenerative disc disease), lumbar    osteo and psoriatic/ hips,legs,knees and shins, finger   Deafness in left ear    Depression    Endometriosis    GERD (gastroesophageal reflux disease)    Hammertoe    right foot   Headache    migraines/ 1 or 2 a year, migraines worse before hysterectomy   History of esophagogastroduodenoscopy (EGD)    HOH (hard of hearing)    bilateral/ aides   Hyperlipidemia    Hypertension    controlled on meds   Low back pain    Lumbar stenosis with neurogenic claudication    Neuromuscular disorder (HCC)    Obesity    Peripheral vascular disease ([place])    Psoriasis    hands and scalp   S/P ear surgery    Stomach ulcer due to nonsteroidal anti-inflammatory drug (NSAID)    Stroke ([place])    3 years ago/ no residual effects   Wears hearing aid in both ears     Past Surgical History:  Procedure Laterality Date   ABDOMINAL HYSTERECTOMY  1990   bilateral oopherectomy   bladder tack     CATARACT EXTRACTION Bilateral    COLONOSCOPY     COLONOSCOPY WITH PROPOFOL N/A 03/27/2015  Procedure: COLONOSCOPY WITH PROPOFOL;  Surgeon: Hulen Luster, MD;  Location: [facility] ENDOSCOPY;  Service: Gastroenterology;  Laterality: N/A;   EXCISION PARTIAL PHALANX Right 07/26/2015   Procedure: EXCISION PARTIAL PHALANX RIGHT 3RD TOE;  Surgeon: [name], DPM;  Location: [place];  Service: Podiatry;  Laterality: Right;  WITH LOCAL   HAMMER TOE SURGERY Right 07/26/2015   Procedure: HAMMER TOE CORRECTION RIGHT 4TH TOE;  Surgeon: [name], DPM;  Location: [place];  Service: Podiatry;  Laterality: Right;   INNER EAR SURGERY Left    as a child/ ear drum   JOINT REPLACEMENT Bilateral    partial knee replacement   kidney stone removal     LIVER BIOPSY     NASAL SEPTOPLASTY W/ TURBINOPLASTY Bilateral 11/09/2020   Procedure: NASAL SEPTOPLASTY WITH INFERIOR  TURBINATE REDUCTION;  Surgeon: Margaretha Sheffield, MD;  Location: [place];  Service: ENT;  Laterality: Bilateral;   TONSILLECTOMY        Current Outpatient Medications:    acetaminophen (TYLENOL) 650 MG CR tablet, Take 650 mg by mouth every 8 (eight) hours as needed for pain., Disp: , Rfl:    albuterol (PROVENTIL HFA;VENTOLIN HFA) 108 (90 Base) MCG/ACT inhaler, Inhale 2 puffs into the lungs every 6 (six) hours as needed for wheezing or shortness of breath., Disp: , Rfl:    aspirin EC 81 MG tablet, Take 81 mg by mouth daily. Swallow whole., Disp: , Rfl:    atorvastatin (LIPITOR) 80 MG tablet, Take 80 mg by mouth daily. pm, Disp: , Rfl:    busPIRone (BUSPAR) 15 MG tablet, Take 1 tablet by mouth 2 (two) times daily., Disp: , Rfl:    gabapentin (NEURONTIN) 300 MG capsule, Take 600 mg by mouth 2 (two) times daily. 1 tab AM, 3 tabs PM, Disp: , Rfl:    lisinopril (PRINIVIL,ZESTRIL) 10 MG tablet, Take 10 mg by mouth daily. pm, Disp: , Rfl:    Melatonin 10 MG CAPS, Take by mouth daily. pm, Disp: , Rfl:    Multiple Vitamins-Minerals (CENTRUM SILVER 50+WOMEN) TABS, Take by mouth daily. am, Disp: , Rfl:    omeprazole (PRILOSEC) 20 MG capsule, Take 20 mg by mouth daily. pm, Disp: , Rfl:    polyethylene glycol (MIRALAX / GLYCOLAX) 17 g packet, Take 17 g by mouth every other day., Disp: , Rfl:    traMADol (ULTRAM) 50 MG tablet, Take 50 mg by mouth 4 (four) times daily. Am,noon,dinner,hs, Disp: , Rfl:    triamcinolone (NASACORT) 55 MCG/ACT AERO nasal inhaler, Place 1 spray into the nose daily., Disp: , Rfl:    venlafaxine (EFFEXOR) 75 MG tablet, Take 75 mg by mouth 2 (two) times daily. am, Disp: , Rfl:    VITAMIN D PO, Take by mouth daily., Disp: , Rfl:    ondansetron (ZOFRAN-ODT) 4 MG disintegrating tablet, Take 1 tablet (4 mg total) by mouth every 8 (eight) hours as needed for nausea or vomiting. (Patient not taking: Reported on 03/15/2022), Disp: 20 tablet, Rfl: 0   Objective:   Vitals:   03/15/22  0920  BP: 133/75  Pulse: 93  SpO2: 97%    Physical Exam Vitals and nursing note reviewed.  Constitutional:      Appearance: Normal appearance.  Cardiovascular:     Rate and Rhythm: Normal rate.  Pulmonary:     Effort: Pulmonary effort is normal.  Abdominal:     General: There is no distension.     Palpations: Abdomen is soft.  Tenderness: There is no abdominal tenderness.  Musculoskeletal:        General: No swelling or deformity.  Skin:    General: Skin is warm.     Capillary Refill: Capillary refill takes less than 2 seconds.  Neurological:     Mental Status: She is alert and oriented to person, place, and time.  Psychiatric:        Mood and Affect: Mood normal.        Behavior: Behavior normal.        Thought Content: Thought content normal.        Judgment: Judgment normal.     Assessment & Plan:  Symptomatic mammary hypertrophy  Chronic bilateral thoracic back pain  Neck pain  The procedure the patient selected and that was best for the patient was discussed. The risk were discussed and include but not limited to the following:  Breast asymmetry, fluid accumulation, firmness of the breast, inability to breast feed, loss of nipple or areola, skin loss, change in skin and nipple sensation, fat necrosis of the breast tissue, bleeding, infection and healing delay.  There are risks of anesthesia and injury to nerves or blood vessels.  Allergic reaction to tape, suture and skin glue are possible.  There will be swelling.  Any of these can lead to the need for revisional surgery.  A breast reduction has potential to interfere with diagnostic procedures in the future.  This procedure is best done when the breast is fully developed.  Changes in the breast will continue to occur over time: pregnancy, weight gain or weigh loss.    Total time: 40 minutes. This includes time spent with the patient during the visit as well as time spent before and after the visit reviewing the  chart, documenting the encounter, ordering pertinent studies and literature for the patient.   Physical therapy: Ordered  I also recommended she decrease her carbohydrates in her sugars for better healing.  I would like to see her after she has her physical therapy.  I still have's concerns about surgery and I expressed this to her.  She is going to think about it but would like to proceed with the physical therapy.  We will plan to see her back in 2 months.  Pictures were obtained of the patient and placed in the chart with the patient's or guardian's permission.   [place], DO

## 2022-03-20 ENCOUNTER — Encounter: Payer: Medicare Other | Admitting: Physician Assistant

## 2022-03-25 ENCOUNTER — Ambulatory Visit: Payer: Medicare Other | Attending: Plastic Surgery

## 2022-03-25 DIAGNOSIS — N62 Hypertrophy of breast: Secondary | ICD-10-CM | POA: Insufficient documentation

## 2022-03-25 DIAGNOSIS — M542 Cervicalgia: Secondary | ICD-10-CM | POA: Insufficient documentation

## 2022-03-25 DIAGNOSIS — M546 Pain in thoracic spine: Secondary | ICD-10-CM | POA: Diagnosis present

## 2022-03-25 DIAGNOSIS — M25512 Pain in left shoulder: Secondary | ICD-10-CM | POA: Diagnosis present

## 2022-03-25 DIAGNOSIS — G8929 Other chronic pain: Secondary | ICD-10-CM | POA: Insufficient documentation

## 2022-03-25 NOTE — Therapy (Signed)
OUTPATIENT PHYSICAL THERAPY CERVICAL EVALUATION   Patient Name: Andrea Fuentes MRN: [id number] DOB:[birth year], 72 y.o., female Today's Date: 03/25/2022  END OF SESSION:  PT End of Session - 03/25/22 1429     Visit Number 1    Number of Visits 10    Date for PT Re-Evaluation 04/23/22    Authorization Type Medicare    Authorization Time Period PT course of 6 weeks    PT Start Time 1130    PT Stop Time 1220    PT Time Calculation (min) 50 min    Activity Tolerance Patient tolerated treatment well    Behavior During Therapy WFL for tasks assessed/performed             Past Medical History:  Diagnosis Date   Anxiety    Asthma    no issue for 8 years   Cancer ([place])    squamous cell ca rt forearm   DDD (degenerative disc disease), lumbar    osteo and psoriatic/ hips,legs,knees and shins, finger   Deafness in left ear    Depression    Endometriosis    GERD (gastroesophageal reflux disease)    Hammertoe    right foot   Headache    migraines/ 1 or 2 a year, migraines worse before hysterectomy   History of esophagogastroduodenoscopy (EGD)    HOH (hard of hearing)    bilateral/ aides   Hyperlipidemia    Hypertension    controlled on meds   Low back pain    Lumbar stenosis with neurogenic claudication    Neuromuscular disorder (HCC)    Obesity    Peripheral vascular disease ([place])    Psoriasis    hands and scalp   S/P ear surgery    Stomach ulcer due to nonsteroidal anti-inflammatory drug (NSAID)    Stroke ([place])    3 years ago/ no residual effects   Wears hearing aid in both ears    Past Surgical History:  Procedure Laterality Date   ABDOMINAL HYSTERECTOMY  1990   bilateral oopherectomy   bladder tack     CATARACT EXTRACTION Bilateral    COLONOSCOPY     COLONOSCOPY WITH PROPOFOL N/A 03/27/2015   Procedure: COLONOSCOPY WITH PROPOFOL;  Surgeon: Hulen Luster, MD;  [facility] ENDOSCOPY;  Service: Gastroenterology;  Laterality: N/A;   EXCISION PARTIAL  PHALANX Right 07/26/2015   Procedure: EXCISION PARTIAL PHALANX RIGHT 3RD TOE;  Surgeon: [name], DPM;  Location: [place];  Service: Podiatry;  Laterality: Right;  WITH LOCAL   HAMMER TOE SURGERY Right 07/26/2015   Procedure: HAMMER TOE CORRECTION RIGHT 4TH TOE;  Surgeon: [name], DPM;  Location: [place];  Service: Podiatry;  Laterality: Right;   INNER EAR SURGERY Left    as a child/ ear drum   JOINT REPLACEMENT Bilateral    partial knee replacement   kidney stone removal     LIVER BIOPSY     NASAL SEPTOPLASTY W/ TURBINOPLASTY Bilateral 11/09/2020   Procedure: NASAL SEPTOPLASTY WITH INFERIOR TURBINATE REDUCTION;  Surgeon: Margaretha Sheffield, MD;  Location: [place];  Service: ENT;  Laterality: Bilateral;   TONSILLECTOMY     Patient Active Problem List   Diagnosis Date Noted   Symptomatic mammary hypertrophy 03/15/2022   Back pain 03/15/2022   Neck pain 03/15/2022    PCP: Dr. Ouida Sills, MD  REFERRING PROVIDER: Dr. Audelia Hives, DO  REFERRING DIAG: M54.2, M54.6, N62  THERAPY DIAG:  Neck pain  Thoracic spine pain  Left  shoulder pain, unspecified chronicity  Rationale for Evaluation and Treatment: Rehabilitation  ONSET DATE: chronic  SUBJECTIVE:                                                                                                                                                                                                         SUBJECTIVE STATEMENT: Pt c/o neck/upper back pain that radiates into her L shoulder/upper arm.    She states she recently met with a surgeon to discuss having breast reduction surgery.  Pt states she is high risk bc of her complex medical hx including blood clotting concerns.  She had h/o CVA ~8 years ago.  She is nervous/apprehensive about surgery.  She has been having a lot of neck pain and it radiates into her L shoulder/upper arm to the elbow and sometimes into the R neck.  She attributes the  neck pain to the weight of her breasts.  She has discomfort from her bra strap pressing down on her shoulders too.  She is having difficulty finding a bra that is comfortable.  She is retired and stays busy at home with housework.  She enjoys water color painting/penciling.  Functionally she is having difficulty lifting heavy pots in kitchen, folding a big load of laundry, changing sheets, lifting her 8 lb dog (Yorkie), driving, drying herself after showering because of her neck and L shoulder pain.  She is right hand dominant.  She would like to try PT before considering moving forward with surgery and is hopeful it will help her feel better.   PERTINENT HISTORY:  Pt notes h/o partial TKA, OA and psoriatic arthritis, low back pain, previous CVA.  See PMH for full list.    PAIN:  Are you having pain?  4-5/10 neck and L shoulder  PRECAUTIONS: None  WEIGHT BEARING RESTRICTIONS: No  FALLS:  Has patient fallen in last 6 months? No  LIVING ENVIRONMENT: Lives with: lives with their spouse Lives in: House/apartment Stairs:  deferred Has following equipment at home:  n/a today  OCCUPATION: retired  PLOF: Independent  PATIENT GOALS: to be able to perform her activities in her home (cooking, cleaning, hobbies) without being limited by her neck/upper back and L shoulder pain; also to be able to turn her head better while driving  NEXT MD VISIT: 2 months  OBJECTIVE:   DIAGNOSTIC FINDINGS:  No imaging reported  PATIENT SURVEYS:  FOTO 39/48  COGNITION: Overall cognitive status: Within functional limits for tasks assessed  SENSATION: Light touch: Impaired  L C6-8 dermatomes  POSTURE: rounded shoulders and  forward head  PALPATION: Pt TTP with palpable trigger points b/l UT, infraspinatus, deltoid; L biceps/pec major   CERVICAL ROM:   Active ROM A/PROM (deg) eval  Flexion 20*  Extension 15*  Right lateral flexion   Left lateral flexion   Right rotation 20  Left rotation 15*    (Blank rows = not tested)  UPPER EXTREMITY ROM:  Active ROM Right eval Left eval  Shoulder flexion 110 105*  Shoulder extension    Shoulder abduction 110 100*  Shoulder adduction    Shoulder extension    Shoulder internal rotation    Shoulder external rotation    Elbow flexion    Elbow extension    Wrist flexion    Wrist extension    Wrist ulnar deviation    Wrist radial deviation    Wrist pronation    Wrist supination     (Blank rows = not tested)  UPPER EXTREMITY MMT:  MMT Right eval Left eval  Shoulder flexion 4 3+*  Shoulder extension    Shoulder abduction 4 3+*  Shoulder adduction    Shoulder extension    Shoulder internal rotation    Shoulder external rotation 4 3+*  Middle trapezius    Lower trapezius    Elbow flexion 4 4  Elbow extension 4 4  Wrist flexion    Wrist extension 4 4  Wrist ulnar deviation    Wrist radial deviation    Thumb ext 4 4  Finger Abd 4 4  Grip strength     (Blank rows = not tested)  CERVICAL SPECIAL TESTS:  Upper limb tension test (ULTT): deferred as pt reports lying supine is too difficult/uncomfortable today + [place] In tact normal reflexes b/l biceps/triceps/brachioradialis  FUNCTIONAL TESTS:  Reach behind back: R thumb to T9, L thumb to L1   TODAY'S TREATMENT:                                                                                                                              DATE: 03/25/22  Scapular retraction with ER 2x10 Standing wall slide shoulder flexion AAROM 2x5 PATIENT EDUCATION:  Education details: PT POC/goals, HEP instruction Person educated: Patient Education method: Explanation, Demonstration, and Handouts Education comprehension: tactile cues required and needs further education  HOME EXERCISE PROGRAM: Access Code: [email or web address] URL: [email or web address] Date: 03/25/2022 Prepared by: Merdis Delay  Exercises - Shoulder Flexion Wall Slide with Towel  - 2 x daily - 7 x weekly -  2 sets - 5 reps - Seated Scapular Retraction with External Rotation  - 2 x daily - 7 x weekly - 2 sets - 5 reps   ASSESSMENT:  CLINICAL IMPRESSION: Patient is a 72 y.o. F who was seen today for physical therapy evaluation and treatment for neck/upper back/L shoulder pain.  Today's session reveals cervicothoracic/shoulder hypomobility, pain, and decreased UE and postural mm strength.  Unable to fully assess glenohumeral joint mobility or fully rule out cervical radiculopathy today  due to pt's limited ability to lie supine.  Will further assess at visit #2.  She is an appropriate candidate for a course of skilled PT to address the impairments and functional limitations below.     OBJECTIVE IMPAIRMENTS: decreased activity tolerance, decreased mobility, decreased ROM, and decreased strength.   ACTIVITY LIMITATIONS: carrying, lifting, reach over head, pulling, pushing  PARTICIPATION LIMITATIONS: meal prep, cleaning, laundry, driving, community activity, and yard work  PERSONAL FACTORS: Age, Time since onset of injury/illness/exacerbation, and 1-2 comorbidities: DDD/mammary hypertrophy  are also affecting patient's functional outcome.   REHAB POTENTIAL: Good  CLINICAL DECISION MAKING: Stable/uncomplicated  EVALUATION COMPLEXITY: Low   GOALS: Goals reviewed with patient? Yes  SHORT TERM GOALS: Target date: 04/08/22  Pt will demonstrate ability to perform scapular retraction for postural mm training without PT tactile cues Baseline: unable Goal status: INITIAL   LONG TERM GOALS: Target date: 05/06/22  Improve FOTO to 48 indicating pt able to perform her housework (like vacuuming/carrying heavy pot in kitchen) without being limited by neck and upper back pain Baseline: 39 Goal status: INITIAL  2.  Improve L shoulder mm strength to >4/5 to promote improved ability to lift and carry heavy loads of laundry Baseline: 3+/5 Goal status: INITIAL  3.  Pt will be able independent with a HEP for  postural mm/spine mm/shoulder mm strengthening to promote improved ability to work on house chores without being limited by pain Baseline: not performing a strengthening program Goal status: INITIAL  4.  Improve cervical spine AROM 5 deg all directions to improve ability to turn head while driving Baseline: L rot limited to 15 deg Goal status: INITIAL  PLAN:  PT FREQUENCY: 1-2x/week  PT DURATION: 6 weeks  PLANNED INTERVENTIONS: Therapeutic exercises, Therapeutic activity, Neuromuscular re-education, Balance training, Gait training, Patient/Family education, Self Care, and Joint mobilization  PLAN FOR NEXT SESSION: postural mm retraining; assess ULTT and response to cervical distraction (if able to lie supine), cervical/shoulder ROM, scapular mm and RTC strengthening; begin with hot/cold for pain control as needed   Pincus Badder, PT 03/25/2022, 2:33 PM

## 2022-04-03 ENCOUNTER — Encounter: Payer: Medicare Other | Admitting: Physician Assistant

## 2022-04-03 ENCOUNTER — Ambulatory Visit: Payer: Medicare Other

## 2022-04-03 DIAGNOSIS — M542 Cervicalgia: Secondary | ICD-10-CM | POA: Diagnosis not present

## 2022-04-03 DIAGNOSIS — M546 Pain in thoracic spine: Secondary | ICD-10-CM

## 2022-04-03 DIAGNOSIS — M25512 Pain in left shoulder: Secondary | ICD-10-CM

## 2022-04-03 NOTE — Therapy (Signed)
OUTPATIENT PHYSICAL THERAPY CERVICOTHORACIC/SHOULDER TREATMENT   Patient Name: Andrea Fuentes MRN: [id number] DOB:December 27, 1950, 72 y.o., female Today's Date: 04/03/2022  END OF SESSION:  PT End of Session - 04/03/22 1108     Visit Number 2    Number of Visits 10    Date for PT Re-Evaluation 04/23/22    Authorization Type Medicare    Authorization Time Period PT course of 6 weeks    PT Start Time 1115    PT Stop Time 1200    PT Time Calculation (min) 45 min    Activity Tolerance Patient tolerated treatment well    Behavior During Therapy WFL for tasks assessed/performed             Past Medical History:  Diagnosis Date   Anxiety    Asthma    no issue for 8 years   Cancer ([place])    squamous cell ca rt forearm   DDD (degenerative disc disease), lumbar    osteo and psoriatic/ hips,legs,knees and shins, finger   Deafness in left ear    Depression    Endometriosis    GERD (gastroesophageal reflux disease)    Hammertoe    right foot   Headache    migraines/ 1 or 2 a year, migraines worse before hysterectomy   History of esophagogastroduodenoscopy (EGD)    HOH (hard of hearing)    bilateral/ aides   Hyperlipidemia    Hypertension    controlled on meds   Low back pain    Lumbar stenosis with neurogenic claudication    Neuromuscular disorder (HCC)    Obesity    Peripheral vascular disease ([place])    Psoriasis    hands and scalp   S/P ear surgery    Stomach ulcer due to nonsteroidal anti-inflammatory drug (NSAID)    Stroke ([place])    3 years ago/ no residual effects   Wears hearing aid in both ears    Past Surgical History:  Procedure Laterality Date   ABDOMINAL HYSTERECTOMY  1990   bilateral oopherectomy   bladder tack     CATARACT EXTRACTION Bilateral    COLONOSCOPY     COLONOSCOPY WITH PROPOFOL N/A 03/27/2015   Procedure: COLONOSCOPY WITH PROPOFOL;  Surgeon: Hulen Luster, MD;  Location: [facility] ENDOSCOPY;  Service: Gastroenterology;  Laterality: N/A;    EXCISION PARTIAL PHALANX Right 07/26/2015   Procedure: EXCISION PARTIAL PHALANX RIGHT 3RD TOE;  Surgeon: [name], DPM;  Location: [place];  Service: Podiatry;  Laterality: Right;  WITH LOCAL   HAMMER TOE SURGERY Right 07/26/2015   Procedure: HAMMER TOE CORRECTION RIGHT 4TH TOE;  Surgeon: [name], DPM;  Location: [place];  Service: Podiatry;  Laterality: Right;   INNER EAR SURGERY Left    as a child/ ear drum   JOINT REPLACEMENT Bilateral    partial knee replacement   kidney stone removal     LIVER BIOPSY     NASAL SEPTOPLASTY W/ TURBINOPLASTY Bilateral 11/09/2020   Procedure: NASAL SEPTOPLASTY WITH INFERIOR TURBINATE REDUCTION;  Surgeon: Margaretha Sheffield, MD;  Location: [place];  Service: ENT;  Laterality: Bilateral;   TONSILLECTOMY     Patient Active Problem List   Diagnosis Date Noted   Symptomatic mammary hypertrophy 03/15/2022   Back pain 03/15/2022   Neck pain 03/15/2022    PCP: Dr. Ouida Sills, MD  REFERRING PROVIDER: Dr. Audelia Hives, DO  REFERRING DIAG: M54.2, M54.6, N62  THERAPY DIAG:  Neck pain  Thoracic spine pain  Left  shoulder pain, unspecified chronicity  Rationale for Evaluation and Treatment: Rehabilitation  ONSET DATE: chronic From Initial Evaluation Note: SUBJECTIVE:                                                                                                                                                                                                         SUBJECTIVE STATEMENT: Pt c/o neck/upper back pain that radiates into her L shoulder/upper arm.    She states she recently met with a surgeon to discuss having breast reduction surgery.  Pt states she is high risk bc of her complex medical hx including blood clotting concerns.  She had h/o CVA ~8 years ago.  She is nervous/apprehensive about surgery.  She has been having a lot of neck pain and it radiates into her L shoulder/upper arm to the elbow and  sometimes into the R neck.  She attributes the neck pain to the weight of her breasts.  She has discomfort from her bra strap pressing down on her shoulders too.  She is having difficulty finding a bra that is comfortable.  She is retired and stays busy at home with housework.  She enjoys water color painting/penciling.  Functionally she is having difficulty lifting heavy pots in kitchen, folding a big load of laundry, changing sheets, lifting her 8 lb dog (Yorkie), driving, drying herself after showering because of her neck and L shoulder pain.  She is right hand dominant.  She would like to try PT before considering moving forward with surgery and is hopeful it will help her feel better.   PERTINENT HISTORY:  Pt notes h/o partial TKA, OA and psoriatic arthritis, low back pain, previous CVA.  See PMH for full list.    PAIN:  Are you having pain?  4-5/10 neck and L shoulder  PRECAUTIONS: None  WEIGHT BEARING RESTRICTIONS: No  FALLS:  Has patient fallen in last 6 months? No  LIVING ENVIRONMENT: Lives with: lives with their spouse Lives in: House/apartment Stairs:  deferred Has following equipment at home:  n/a today  OCCUPATION: retired  PLOF: Independent  PATIENT GOALS: to be able to perform her activities in her home (cooking, cleaning, hobbies) without being limited by her neck/upper back and L shoulder pain; also to be able to turn her head better while driving  NEXT MD VISIT: 2 months  OBJECTIVE:   DIAGNOSTIC FINDINGS:  No imaging reported  PATIENT SURVEYS:  FOTO 39/48  COGNITION: Overall cognitive status: Within functional limits for tasks assessed  SENSATION: Light touch: Impaired  L C6-8 dermatomes  POSTURE:  rounded shoulders and forward head  PALPATION: Pt TTP with palpable trigger points b/l UT, infraspinatus, deltoid; L biceps/pec major   CERVICAL ROM:   Active ROM A/PROM (deg) eval  Flexion 20*  Extension 15*  Right lateral flexion   Left lateral  flexion   Right rotation 20  Left rotation 15*   (Blank rows = not tested)  UPPER EXTREMITY ROM:  Active ROM Right eval Left eval  Shoulder flexion 110 105*  Shoulder extension    Shoulder abduction 110 100*  Shoulder adduction    Shoulder extension    Shoulder internal rotation    Shoulder external rotation    Elbow flexion    Elbow extension    Wrist flexion    Wrist extension    Wrist ulnar deviation    Wrist radial deviation    Wrist pronation    Wrist supination     (Blank rows = not tested)  UPPER EXTREMITY MMT:  MMT Right eval Left eval  Shoulder flexion 4 3+*  Shoulder extension    Shoulder abduction 4 3+*  Shoulder adduction    Shoulder extension    Shoulder internal rotation    Shoulder external rotation 4 3+*  Middle trapezius    Lower trapezius    Elbow flexion 4 4  Elbow extension 4 4  Wrist flexion    Wrist extension 4 4  Wrist ulnar deviation    Wrist radial deviation    Thumb ext 4 4  Finger Abd 4 4  Grip strength     (Blank rows = not tested)  CERVICAL SPECIAL TESTS:  Upper limb tension test (ULTT): deferred as pt reports lying supine is too difficult/uncomfortable today + Spurling In tact normal reflexes b/l biceps/triceps/brachioradialis  FUNCTIONAL TESTS:  Reach behind back: R thumb to T9, L thumb to L1   TODAY'S TREATMENT:                                                                                                                              DATE:  Subjective: Pt states she had an injection in her low back and this is helpful.  She worked on her HEP and feels like her shoulder is moving a little better.    Pain: 4/10 L neck/shoulder            Therapeutic Exercise:  Scapular retraction with ER 2x10 Standing wall slide shoulder flexion AAROM 2x5 Standing palm press into wall 5 second x 10 (was too uncomfortable for her low back/hip) Standing rows, green TB 2x12 Supine shoulder flexion AAROM 2x5 with dowel  Manual  Therapy: Shoulder PROM- flexion, ER/IR Cervical spine manual traction: 10 sec holds Cervical PROM- rotation R and L x5 ea  Updated HEP, see below  PATIENT EDUCATION:  Education details: PT POC/goals, HEP instruction Person educated: Patient Education method: Explanation, Demonstration, and Handouts Education comprehension: tactile cues required and needs further education  HOME EXERCISE PROGRAM: Access Code: [email or web address]  URL: [email or web address] Date: 04/03/2022 Prepared by: Merdis Delay  Exercises - Shoulder Flexion Wall Slide with Towel  - 2 x daily - 7 x weekly - 2 sets - 5 reps - Seated Scapular Retraction with External Rotation  - 2 x daily - 7 x weekly - 2 sets - 5 reps - Seated Shoulder Row with Anchored Resistance  - 1 x daily - 7 x weekly - 2 sets - 12 reps - Supine Shoulder Flexion with Dowel  - 1 x daily - 7 x weekly - 1 sets - 10 reps - 5 hold   ASSESSMENT:  CLINICAL IMPRESSION: Patient notes reduced pain during manual cervical traction today.  At end of session she was able to actively flex L shoulder to 110 degrees, which is an improvement from first session.  Able to progress scapular/postural mm retraining.  She tolerated rows better than standing isometric at wall.  She is an appropriate candidate for continued skilled PT to address the impairments and functional limitations below.     OBJECTIVE IMPAIRMENTS: decreased activity tolerance, decreased mobility, decreased ROM, and decreased strength.   ACTIVITY LIMITATIONS: carrying, lifting, reach over head, pulling, pushing  PARTICIPATION LIMITATIONS: meal prep, cleaning, laundry, driving, community activity, and yard work  PERSONAL FACTORS: Age, Time since onset of injury/illness/exacerbation, and 1-2 comorbidities: DDD/mammary hypertrophy  are also affecting patient's functional outcome.   REHAB POTENTIAL: Good  CLINICAL DECISION MAKING: Stable/uncomplicated  EVALUATION COMPLEXITY:  Low   GOALS: Goals reviewed with patient? Yes  SHORT TERM GOALS: Target date: 04/08/22  Pt will demonstrate ability to perform scapular retraction for postural mm training without PT tactile cues Baseline: unable Goal status: INITIAL   LONG TERM GOALS: Target date: 05/06/22  Improve FOTO to 48 indicating pt able to perform her housework (like vacuuming/carrying heavy pot in kitchen) without being limited by neck and upper back pain Baseline: 39 Goal status: INITIAL  2.  Improve L shoulder mm strength to >4/5 to promote improved ability to lift and carry heavy loads of laundry Baseline: 3+/5 Goal status: INITIAL  3.  Pt will be able independent with a HEP for postural mm/spine mm/shoulder mm strengthening to promote improved ability to work on house chores without being limited by pain Baseline: not performing a strengthening program Goal status: INITIAL  4.  Improve cervical spine AROM 5 deg all directions to improve ability to turn head while driving Baseline: L rot limited to 15 deg Goal status: INITIAL  PLAN:  PT FREQUENCY: 1-2x/week  PT DURATION: 6 weeks  PLANNED INTERVENTIONS: Therapeutic exercises, Therapeutic activity, Neuromuscular re-education, Balance training, Gait training, Patient/Family education, Self Care, and Joint mobilization  PLAN FOR NEXT SESSION: postural mm retraining; continue with cervical/shoulder ROM, scapular mm and RTC strengthening; begin with hot/cold for pain control as needed  Merdis Delay, PT, DPT, OCS  [id number]  Pincus Badder, PT 04/03/2022, 12:13 PM

## 2022-04-08 ENCOUNTER — Ambulatory Visit: Payer: Medicare Other | Attending: Plastic Surgery

## 2022-04-08 DIAGNOSIS — M25512 Pain in left shoulder: Secondary | ICD-10-CM | POA: Insufficient documentation

## 2022-04-08 DIAGNOSIS — M542 Cervicalgia: Secondary | ICD-10-CM | POA: Diagnosis not present

## 2022-04-08 DIAGNOSIS — M546 Pain in thoracic spine: Secondary | ICD-10-CM | POA: Diagnosis present

## 2022-04-08 NOTE — Therapy (Signed)
OUTPATIENT PHYSICAL THERAPY CERVICOTHORACIC/SHOULDER TREATMENT   Patient Name: Andrea Fuentes MRN: [id number] DOB:11-06-1950, 72 y.o., female Today's Date: 04/08/2022  END OF SESSION:  PT End of Session - 04/08/22 1142     Visit Number 3    Number of Visits 10    Date for PT Re-Evaluation 04/23/22    Authorization Type Medicare    Authorization Time Period PT course of 6 weeks    PT Start Time 1115    PT Stop Time 1200    PT Time Calculation (min) 45 min    Activity Tolerance Patient tolerated treatment well    Behavior During Therapy WFL for tasks assessed/performed             Past Medical History:  Diagnosis Date   Anxiety    Asthma    no issue for 8 years   Cancer ([place])    squamous cell ca rt forearm   DDD (degenerative disc disease), lumbar    osteo and psoriatic/ hips,legs,knees and shins, finger   Deafness in left ear    Depression    Endometriosis    GERD (gastroesophageal reflux disease)    Hammertoe    right foot   Headache    migraines/ 1 or 2 a year, migraines worse before hysterectomy   History of esophagogastroduodenoscopy (EGD)    HOH (hard of hearing)    bilateral/ aides   Hyperlipidemia    Hypertension    controlled on meds   Low back pain    Lumbar stenosis with neurogenic claudication    Neuromuscular disorder (HCC)    Obesity    Peripheral vascular disease ([place])    Psoriasis    hands and scalp   S/P ear surgery    Stomach ulcer due to nonsteroidal anti-inflammatory drug (NSAID)    Stroke ([place])    3 years ago/ no residual effects   Wears hearing aid in both ears    Past Surgical History:  Procedure Laterality Date   ABDOMINAL HYSTERECTOMY  1990   bilateral oopherectomy   bladder tack     CATARACT EXTRACTION Bilateral    COLONOSCOPY     COLONOSCOPY WITH PROPOFOL N/A 03/27/2015   Procedure: COLONOSCOPY WITH PROPOFOL;  Surgeon: Hulen Luster, MD;  Location: [facility] ENDOSCOPY;  Service: Gastroenterology;  Laterality: N/A;   EXCISION  PARTIAL PHALANX Right 07/26/2015   Procedure: EXCISION PARTIAL PHALANX RIGHT 3RD TOE;  Surgeon: [name], DPM;  Location: [place];  Service: Podiatry;  Laterality: Right;  WITH LOCAL   HAMMER TOE SURGERY Right 07/26/2015   Procedure: HAMMER TOE CORRECTION RIGHT 4TH TOE;  Surgeon: [name], DPM;  Location: [place];  Service: Podiatry;  Laterality: Right;   INNER EAR SURGERY Left    as a child/ ear drum   JOINT REPLACEMENT Bilateral    partial knee replacement   kidney stone removal     LIVER BIOPSY     NASAL SEPTOPLASTY W/ TURBINOPLASTY Bilateral 11/09/2020   Procedure: NASAL SEPTOPLASTY WITH INFERIOR TURBINATE REDUCTION;  Surgeon: Margaretha Sheffield, MD;  Location: [place];  Service: ENT;  Laterality: Bilateral;   TONSILLECTOMY     Patient Active Problem List   Diagnosis Date Noted   Symptomatic mammary hypertrophy 03/15/2022   Back pain 03/15/2022   Neck pain 03/15/2022    PCP: Dr. Ouida Sills, MD  REFERRING PROVIDER: Dr. Audelia Hives, DO  REFERRING DIAG: M54.2, M54.6, N62  THERAPY DIAG:  Neck pain  Thoracic spine pain  Left  shoulder pain, unspecified chronicity  Rationale for Evaluation and Treatment: Rehabilitation  ONSET DATE: chronic From Initial Evaluation Note: SUBJECTIVE:                                                                                                                                                                                                         SUBJECTIVE STATEMENT: Pt c/o neck/upper back pain that radiates into her L shoulder/upper arm.    She states she recently met with a surgeon to discuss having breast reduction surgery.  Pt states she is high risk bc of her complex medical hx including blood clotting concerns.  She had h/o CVA ~8 years ago.  She is nervous/apprehensive about surgery.  She has been having a lot of neck pain and it radiates into her L shoulder/upper arm to the elbow and sometimes  into the R neck.  She attributes the neck pain to the weight of her breasts.  She has discomfort from her bra strap pressing down on her shoulders too.  She is having difficulty finding a bra that is comfortable.  She is retired and stays busy at home with housework.  She enjoys water color painting/penciling.  Functionally she is having difficulty lifting heavy pots in kitchen, folding a big load of laundry, changing sheets, lifting her 8 lb dog (Yorkie), driving, drying herself after showering because of her neck and L shoulder pain.  She is right hand dominant.  She would like to try PT before considering moving forward with surgery and is hopeful it will help her feel better.   PERTINENT HISTORY:  Pt notes h/o partial TKA, OA and psoriatic arthritis, low back pain, previous CVA.  See PMH for full list.    PAIN:  Are you having pain?  4-5/10 neck and L shoulder  PRECAUTIONS: None  WEIGHT BEARING RESTRICTIONS: No  FALLS:  Has patient fallen in last 6 months? No  LIVING ENVIRONMENT: Lives with: lives with their spouse Lives in: House/apartment Stairs:  deferred Has following equipment at home:  n/a today  OCCUPATION: retired  PLOF: Independent  PATIENT GOALS: to be able to perform her activities in her home (cooking, cleaning, hobbies) without being limited by her neck/upper back and L shoulder pain; also to be able to turn her head better while driving  NEXT MD VISIT: 2 months  OBJECTIVE:   DIAGNOSTIC FINDINGS:  No imaging reported  PATIENT SURVEYS:  FOTO 39/48  COGNITION: Overall cognitive status: Within functional limits for tasks assessed  SENSATION: Light touch: Impaired  L C6-8 dermatomes  POSTURE:  rounded shoulders and forward head  PALPATION: Pt TTP with palpable trigger points b/l UT, infraspinatus, deltoid; L biceps/pec major   CERVICAL ROM:   Active ROM A/PROM (deg) eval  Flexion 20*  Extension 15*  Right lateral flexion   Left lateral flexion    Right rotation 20  Left rotation 15*   (Blank rows = not tested)  UPPER EXTREMITY ROM:  Active ROM Right eval Left eval  Shoulder flexion 110 105*  Shoulder extension    Shoulder abduction 110 100*  Shoulder adduction    Shoulder extension    Shoulder internal rotation    Shoulder external rotation    Elbow flexion    Elbow extension    Wrist flexion    Wrist extension    Wrist ulnar deviation    Wrist radial deviation    Wrist pronation    Wrist supination     (Blank rows = not tested)  UPPER EXTREMITY MMT:  MMT Right eval Left eval  Shoulder flexion 4 3+*  Shoulder extension    Shoulder abduction 4 3+*  Shoulder adduction    Shoulder extension    Shoulder internal rotation    Shoulder external rotation 4 3+*  Middle trapezius    Lower trapezius    Elbow flexion 4 4  Elbow extension 4 4  Wrist flexion    Wrist extension 4 4  Wrist ulnar deviation    Wrist radial deviation    Thumb ext 4 4  Finger Abd 4 4  Grip strength     (Blank rows = not tested)  CERVICAL SPECIAL TESTS:  Upper limb tension test (ULTT): deferred as pt reports lying supine is too difficult/uncomfortable today + Spurling In tact normal reflexes b/l biceps/triceps/brachioradialis  FUNCTIONAL TESTS:  Reach behind back: R thumb to T9, L thumb to L1   TODAY'S TREATMENT:                                                                                                                              DATE:  Subjective: Pt states she worked on her HEP and feels like her shoulder is moving a little better.  Overall she feels like she is making progress with PT.  No questions upon arrival.    Pain: 4/10 L neck/shoulder            Therapeutic Exercise:  Scapular retraction with ER 2x10 Standing wall slide shoulder flexion AAROM 2x5- not today (HEP) Standing palm press into wall 5 second x 10 (was too uncomfortable for her low back/hip) Isometric ER/IR with pt sitting upright and PT resistance: 5  second holds x10 ea direction Pull/push with wand and PT resistance: 2x5 ea direction Standing rows, green TB 2x12- not today (HEP) Supine shoulder flexion AAROM 2x5 with dowel  Manual Therapy: Shoulder PROM- flexion, ER/IR Cervical spine manual traction: 10 sec holds Cervical PROM- rotation R and L x5 ea STM L UT/lev scap, manual stretch  Updated HEP, see below  PATIENT EDUCATION:  Education details: PT POC/goals, HEP instruction Person educated: Patient Education method: Explanation, Demonstration, and Handouts Education comprehension: tactile cues required and needs further education  HOME EXERCISE PROGRAM: Access Code: [email or web address] URL: [email or web address] Date: 04/03/2022 Prepared by: Merdis Delay  Exercises - Shoulder Flexion Wall Slide with Towel  - 2 x daily - 7 x weekly - 2 sets - 5 reps - Seated Scapular Retraction with External Rotation  - 2 x daily - 7 x weekly - 2 sets - 5 reps - Seated Shoulder Row with Anchored Resistance  - 1 x daily - 7 x weekly - 2 sets - 12 reps - Supine Shoulder Flexion with Dowel  - 1 x daily - 7 x weekly - 1 sets - 10 reps - 5 hold   ASSESSMENT:  CLINICAL IMPRESSION: Patient's L shoulder flexion AROM overall is improving.  Able to flex to 140 deg at end of session today.  Able to tolerate progression in strengthening with addition of isometrics and functional push/pull exercises with PT resistance today.  Cervical spine AROM remains limited with all directions, but she tolerates manual therapy for cervical spine well today.  She is an appropriate candidate for continued skilled PT to address the impairments and functional limitations below.     OBJECTIVE IMPAIRMENTS: decreased activity tolerance, decreased mobility, decreased ROM, and decreased strength.   ACTIVITY LIMITATIONS: carrying, lifting, reach over head, pulling, pushing  PARTICIPATION LIMITATIONS: meal prep, cleaning, laundry, driving, community activity, and  yard work  PERSONAL FACTORS: Age, Time since onset of injury/illness/exacerbation, and 1-2 comorbidities: DDD/mammary hypertrophy  are also affecting patient's functional outcome.   REHAB POTENTIAL: Good  CLINICAL DECISION MAKING: Stable/uncomplicated  EVALUATION COMPLEXITY: Low   GOALS: Goals reviewed with patient? Yes  SHORT TERM GOALS: Target date: 04/08/22  Pt will demonstrate ability to perform scapular retraction for postural mm training without PT tactile cues Baseline: unable Goal status: INITIAL   LONG TERM GOALS: Target date: 05/06/22  Improve FOTO to 48 indicating pt able to perform her housework (like vacuuming/carrying heavy pot in kitchen) without being limited by neck and upper back pain Baseline: 39 Goal status: INITIAL  2.  Improve L shoulder mm strength to >4/5 to promote improved ability to lift and carry heavy loads of laundry Baseline: 3+/5 Goal status: INITIAL  3.  Pt will be able independent with a HEP for postural mm/spine mm/shoulder mm strengthening to promote improved ability to work on house chores without being limited by pain Baseline: not performing a strengthening program Goal status: INITIAL  4.  Improve cervical spine AROM 5 deg all directions to improve ability to turn head while driving Baseline: L rot limited to 15 deg Goal status: INITIAL  PLAN:  PT FREQUENCY: 1-2x/week  PT DURATION: 6 weeks  PLANNED INTERVENTIONS: Therapeutic exercises, Therapeutic activity, Neuromuscular re-education, Balance training, Gait training, Patient/Family education, Self Care, and Joint mobilization  PLAN FOR NEXT SESSION: postural mm retraining; continue with cervical/shoulder ROM, scapular mm and RTC strengthening; begin with hot/cold for pain control as needed. Wednesday progress HEP- add isometrics to HEP and cervical spine ROM.  Merdis Delay, PT, DPT, OCS  [id number]  Pincus Badder, PT 04/08/2022, 12:03 PM

## 2022-04-15 ENCOUNTER — Ambulatory Visit: Payer: Medicare Other

## 2022-04-15 DIAGNOSIS — M542 Cervicalgia: Secondary | ICD-10-CM

## 2022-04-15 DIAGNOSIS — M546 Pain in thoracic spine: Secondary | ICD-10-CM

## 2022-04-15 DIAGNOSIS — M25512 Pain in left shoulder: Secondary | ICD-10-CM

## 2022-04-15 NOTE — Therapy (Signed)
OUTPATIENT PHYSICAL THERAPY CERVICOTHORACIC/SHOULDER TREATMENT   Patient Name: Andrea Fuentes MRN: [id number] DOB:January 05, 1951, 72 y.o., female Today's Date: 04/15/2022  END OF SESSION:  PT End of Session - 04/15/22 1125     Visit Number 4    Number of Visits 10    Date for PT Re-Evaluation 04/23/22    Authorization Type Medicare    Authorization Time Period PT course of 6 weeks    PT Start Time 1115    PT Stop Time 1200    PT Time Calculation (min) 45 min    Activity Tolerance Patient tolerated treatment well    Behavior During Therapy WFL for tasks assessed/performed             Past Medical History:  Diagnosis Date   Anxiety    Asthma    no issue for 8 years   Cancer ([place])    squamous cell ca rt forearm   DDD (degenerative disc disease), lumbar    osteo and psoriatic/ hips,legs,knees and shins, finger   Deafness in left ear    Depression    Endometriosis    GERD (gastroesophageal reflux disease)    Hammertoe    right foot   Headache    migraines/ 1 or 2 a year, migraines worse before hysterectomy   History of esophagogastroduodenoscopy (EGD)    HOH (hard of hearing)    bilateral/ aides   Hyperlipidemia    Hypertension    controlled on meds   Low back pain    Lumbar stenosis with neurogenic claudication    Neuromuscular disorder (HCC)    Obesity    Peripheral vascular disease ([place])    Psoriasis    hands and scalp   S/P ear surgery    Stomach ulcer due to nonsteroidal anti-inflammatory drug (NSAID)    Stroke ([place])    3 years ago/ no residual effects   Wears hearing aid in both ears    Past Surgical History:  Procedure Laterality Date   ABDOMINAL HYSTERECTOMY  1990   bilateral oopherectomy   bladder tack     CATARACT EXTRACTION Bilateral    COLONOSCOPY     COLONOSCOPY WITH PROPOFOL N/A 03/27/2015   Procedure: COLONOSCOPY WITH PROPOFOL;  Surgeon: Hulen Luster, MD;  Location: [facility] ENDOSCOPY;  Service: Gastroenterology;  Laterality: N/A;    EXCISION PARTIAL PHALANX Right 07/26/2015   Procedure: EXCISION PARTIAL PHALANX RIGHT 3RD TOE;  Surgeon: [name], DPM;  Location: [place];  Service: Podiatry;  Laterality: Right;  WITH LOCAL   HAMMER TOE SURGERY Right 07/26/2015   Procedure: HAMMER TOE CORRECTION RIGHT 4TH TOE;  Surgeon: [name], DPM;  Location: [place];  Service: Podiatry;  Laterality: Right;   INNER EAR SURGERY Left    as a child/ ear drum   JOINT REPLACEMENT Bilateral    partial knee replacement   kidney stone removal     LIVER BIOPSY     NASAL SEPTOPLASTY W/ TURBINOPLASTY Bilateral 11/09/2020   Procedure: NASAL SEPTOPLASTY WITH INFERIOR TURBINATE REDUCTION;  Surgeon: Margaretha Sheffield, MD;  Location: [place];  Service: ENT;  Laterality: Bilateral;   TONSILLECTOMY     Patient Active Problem List   Diagnosis Date Noted   Symptomatic mammary hypertrophy 03/15/2022   Back pain 03/15/2022   Neck pain 03/15/2022    PCP: Dr. Ouida Sills, MD  REFERRING PROVIDER: Dr. Audelia Hives, DO  REFERRING DIAG: M54.2, M54.6, N62  THERAPY DIAG:  Neck pain  Thoracic spine pain  Left  shoulder pain, unspecified chronicity  Rationale for Evaluation and Treatment: Rehabilitation  ONSET DATE: chronic From Initial Evaluation Note: SUBJECTIVE:                                                                                                                                                                                                         SUBJECTIVE STATEMENT: Pt c/o neck/upper back pain that radiates into her L shoulder/upper arm.    She states she recently met with a surgeon to discuss having breast reduction surgery.  Pt states she is high risk bc of her complex medical hx including blood clotting concerns.  She had h/o CVA ~8 years ago.  She is nervous/apprehensive about surgery.  She has been having a lot of neck pain and it radiates into her L shoulder/upper arm to the elbow and  sometimes into the R neck.  She attributes the neck pain to the weight of her breasts.  She has discomfort from her bra strap pressing down on her shoulders too.  She is having difficulty finding a bra that is comfortable.  She is retired and stays busy at home with housework.  She enjoys water color painting/penciling.  Functionally she is having difficulty lifting heavy pots in kitchen, folding a big load of laundry, changing sheets, lifting her 8 lb dog (Yorkie), driving, drying herself after showering because of her neck and L shoulder pain.  She is right hand dominant.  She would like to try PT before considering moving forward with surgery and is hopeful it will help her feel better.   PERTINENT HISTORY:  Pt notes h/o partial TKA, OA and psoriatic arthritis, low back pain, previous CVA.  See PMH for full list.    PAIN:  Are you having pain?  4-5/10 neck and L shoulder  PRECAUTIONS: None  WEIGHT BEARING RESTRICTIONS: No  FALLS:  Has patient fallen in last 6 months? No  LIVING ENVIRONMENT: Lives with: lives with their spouse Lives in: House/apartment Stairs:  deferred Has following equipment at home:  n/a today  OCCUPATION: retired  PLOF: Independent  PATIENT GOALS: to be able to perform her activities in her home (cooking, cleaning, hobbies) without being limited by her neck/upper back and L shoulder pain; also to be able to turn her head better while driving  NEXT MD VISIT: 2 months  OBJECTIVE:   DIAGNOSTIC FINDINGS:  No imaging reported  PATIENT SURVEYS:  FOTO 39/48  COGNITION: Overall cognitive status: Within functional limits for tasks assessed  SENSATION: Light touch: Impaired  L C6-8 dermatomes  POSTURE:  rounded shoulders and forward head  PALPATION: Pt TTP with palpable trigger points b/l UT, infraspinatus, deltoid; L biceps/pec major   CERVICAL ROM:   Active ROM A/PROM (deg) eval  Flexion 20*  Extension 15*  Right lateral flexion   Left lateral  flexion   Right rotation 20  Left rotation 15*   (Blank rows = not tested)  UPPER EXTREMITY ROM:  Active ROM Right eval Left eval  Shoulder flexion 110 105*  Shoulder extension    Shoulder abduction 110 100*  Shoulder adduction    Shoulder extension    Shoulder internal rotation    Shoulder external rotation    Elbow flexion    Elbow extension    Wrist flexion    Wrist extension    Wrist ulnar deviation    Wrist radial deviation    Wrist pronation    Wrist supination     (Blank rows = not tested)  UPPER EXTREMITY MMT:  MMT Right eval Left eval  Shoulder flexion 4 3+*  Shoulder extension    Shoulder abduction 4 3+*  Shoulder adduction    Shoulder extension    Shoulder internal rotation    Shoulder external rotation 4 3+*  Middle trapezius    Lower trapezius    Elbow flexion 4 4  Elbow extension 4 4  Wrist flexion    Wrist extension 4 4  Wrist ulnar deviation    Wrist radial deviation    Thumb ext 4 4  Finger Abd 4 4  Grip strength     (Blank rows = not tested)  CERVICAL SPECIAL TESTS:  Upper limb tension test (ULTT): deferred as pt reports lying supine is too difficult/uncomfortable today + Spurling In tact normal reflexes b/l biceps/triceps/brachioradialis  FUNCTIONAL TESTS:  Reach behind back: R thumb to T9, L thumb to L1   TODAY'S TREATMENT:                                                                                                                              DATE:  Subjective: Pt states she has a current sinus infection.  She is having some L ear drainage associated with this and is also not comfortable lying down.  She sees her PCP for this tomorrow.  She does want to participate in PT as much as possible.  Overall her neck and shoulder are feeling better.  No questions upon arrival.    Pain: 3/10 L neck/shoulder            Therapeutic Exercise:  In seated/partially reclined position today as pt has sinus issues and cannot lie supine- Elbow  flexion/extension AROM x10 Isometric ER/IR with pt sitting upright and PT resistance: 5 second holds x10 ea direction "Stat[place] of liberty" 3x7 Manual resisted elbow extension with PT Pull/push with wand and PT resistance: 2x10 ea direction   Manual Therapy: In seated position- STM L UT/lev scap, manual stretch   Not today: Scapular retraction with  ER 2x10 Standing wall slide shoulder flexion AAROM 2x5- not today (HEP) Standing palm press into wall 5 second x 10 (was too uncomfortable for her low back/hip) Standing rows, green TB 2x12- not today (HEP) Supine shoulder flexion AAROM 2x5 with dowel  Updated HEP, see below  PATIENT EDUCATION:  Education details: PT POC/goals, HEP instruction Person educated: Patient Education method: Explanation, Demonstration, and Handouts Education comprehension: tactile cues required and needs further education  HOME EXERCISE PROGRAM: Access Code: [email or web address] URL: [email or web address] Date: 04/03/2022 Prepared by: Merdis Delay  Exercises - Shoulder Flexion Wall Slide with Towel  - 2 x daily - 7 x weekly - 2 sets - 5 reps - Seated Scapular Retraction with External Rotation  - 2 x daily - 7 x weekly - 2 sets - 5 reps - Seated Shoulder Row with Anchored Resistance  - 1 x daily - 7 x weekly - 2 sets - 12 reps - Supine Shoulder Flexion with Dowel  - 1 x daily - 7 x weekly - 1 sets - 10 reps - 5 hold   ASSESSMENT:  CLINICAL IMPRESSION: Pt able to tolerate modified PT session today due to her not feeling well but still wanting to participate in tx.  Performed STM for cervical spine in sitting today and she tolerated this very well.  Cervical spine rotation R and L are symmetrical at end of session.  She is an appropriate candidate for continued skilled PT to address the impairments and functional limitations below.     OBJECTIVE IMPAIRMENTS: decreased activity tolerance, decreased mobility, decreased ROM, and decreased strength.    ACTIVITY LIMITATIONS: carrying, lifting, reach over head, pulling, pushing  PARTICIPATION LIMITATIONS: meal prep, cleaning, laundry, driving, community activity, and yard work  PERSONAL FACTORS: Age, Time since onset of injury/illness/exacerbation, and 1-2 comorbidities: DDD/mammary hypertrophy  are also affecting patient's functional outcome.   REHAB POTENTIAL: Good  CLINICAL DECISION MAKING: Stable/uncomplicated  EVALUATION COMPLEXITY: Low   GOALS: Goals reviewed with patient? Yes  SHORT TERM GOALS: Target date: 04/08/22  Pt will demonstrate ability to perform scapular retraction for postural mm training without PT tactile cues Baseline: unable Goal status: INITIAL   LONG TERM GOALS: Target date: 05/06/22  Improve FOTO to 48 indicating pt able to perform her housework (like vacuuming/carrying heavy pot in kitchen) without being limited by neck and upper back pain Baseline: 39 Goal status: INITIAL  2.  Improve L shoulder mm strength to >4/5 to promote improved ability to lift and carry heavy loads of laundry Baseline: 3+/5 Goal status: INITIAL  3.  Pt will be able independent with a HEP for postural mm/spine mm/shoulder mm strengthening to promote improved ability to work on house chores without being limited by pain Baseline: not performing a strengthening program Goal status: INITIAL  4.  Improve cervical spine AROM 5 deg all directions to improve ability to turn head while driving Baseline: L rot limited to 15 deg Goal status: INITIAL  PLAN:  PT FREQUENCY: 1-2x/week  PT DURATION: 6 weeks  PLANNED INTERVENTIONS: Therapeutic exercises, Therapeutic activity, Neuromuscular re-education, Balance training, Gait training, Patient/Family education, Self Care, and Joint mobilization  PLAN FOR NEXT SESSION: postural mm retraining; continue with cervical/shoulder ROM, scapular mm and RTC strengthening; begin with hot/cold for pain control as needed. Wednesday progress HEP-  add isometrics to HEP and cervical spine ROM.  Merdis Delay, PT, DPT, OCS  [id number]  Pincus Badder, PT 04/15/2022, 2:12 PM

## 2022-04-17 ENCOUNTER — Ambulatory Visit: Payer: Medicare Other

## 2022-04-17 DIAGNOSIS — M546 Pain in thoracic spine: Secondary | ICD-10-CM

## 2022-04-17 DIAGNOSIS — M25512 Pain in left shoulder: Secondary | ICD-10-CM

## 2022-04-17 DIAGNOSIS — M542 Cervicalgia: Secondary | ICD-10-CM | POA: Diagnosis not present

## 2022-04-17 NOTE — Therapy (Signed)
OUTPATIENT PHYSICAL THERAPY CERVICOTHORACIC/SHOULDER TREATMENT   Patient Name: Andrea Fuentes MRN: [id number] DOB:1950/12/24, 72 y.o., female Today's Date: 04/17/2022  END OF SESSION:  PT End of Session - 04/17/22 1218     Visit Number 5    Number of Visits 10    Authorization Type Medicare, PN on visit #10    Authorization Time Period PT course of 6 weeks    PT Start Time 1120    PT Stop Time 1200    PT Time Calculation (min) 40 min    Activity Tolerance Patient tolerated treatment well    Behavior During Therapy WFL for tasks assessed/performed             Past Medical History:  Diagnosis Date   Anxiety    Asthma    no issue for 8 years   Cancer ([place])    squamous cell ca rt forearm   DDD (degenerative disc disease), lumbar    osteo and psoriatic/ hips,legs,knees and shins, finger   Deafness in left ear    Depression    Endometriosis    GERD (gastroesophageal reflux disease)    Hammertoe    right foot   Headache    migraines/ 1 or 2 a year, migraines worse before hysterectomy   History of esophagogastroduodenoscopy (EGD)    HOH (hard of hearing)    bilateral/ aides   Hyperlipidemia    Hypertension    controlled on meds   Low back pain    Lumbar stenosis with neurogenic claudication    Neuromuscular disorder (HCC)    Obesity    Peripheral vascular disease ([place])    Psoriasis    hands and scalp   S/P ear surgery    Stomach ulcer due to nonsteroidal anti-inflammatory drug (NSAID)    Stroke ([place])    3 years ago/ no residual effects   Wears hearing aid in both ears    Past Surgical History:  Procedure Laterality Date   ABDOMINAL HYSTERECTOMY  1990   bilateral oopherectomy   bladder tack     CATARACT EXTRACTION Bilateral    COLONOSCOPY     COLONOSCOPY WITH PROPOFOL N/A 03/27/2015   Procedure: COLONOSCOPY WITH PROPOFOL;  Surgeon: Hulen Luster, MD;  Location: [facility] ENDOSCOPY;  Service: Gastroenterology;  Laterality: N/A;   EXCISION PARTIAL PHALANX  Right 07/26/2015   Procedure: EXCISION PARTIAL PHALANX RIGHT 3RD TOE;  Surgeon: [name], DPM;  Location: [place];  Service: Podiatry;  Laterality: Right;  WITH LOCAL   HAMMER TOE SURGERY Right 07/26/2015   Procedure: HAMMER TOE CORRECTION RIGHT 4TH TOE;  Surgeon: [name], DPM;  Location: [place];  Service: Podiatry;  Laterality: Right;   INNER EAR SURGERY Left    as a child/ ear drum   JOINT REPLACEMENT Bilateral    partial knee replacement   kidney stone removal     LIVER BIOPSY     NASAL SEPTOPLASTY W/ TURBINOPLASTY Bilateral 11/09/2020   Procedure: NASAL SEPTOPLASTY WITH INFERIOR TURBINATE REDUCTION;  Surgeon: Margaretha Sheffield, MD;  Location: [place];  Service: ENT;  Laterality: Bilateral;   TONSILLECTOMY     Patient Active Problem List   Diagnosis Date Noted   Symptomatic mammary hypertrophy 03/15/2022   Back pain 03/15/2022   Neck pain 03/15/2022    PCP: Dr. Ouida Sills, MD  REFERRING PROVIDER: Dr. Audelia Hives, DO  REFERRING DIAG: M54.2, M54.6, N62  THERAPY DIAG:  Neck pain  Thoracic spine pain  Left shoulder pain, unspecified chronicity  Rationale for Evaluation and Treatment: Rehabilitation  ONSET DATE: chronic From Initial Evaluation Note: SUBJECTIVE:                                                                                                                                                                                                         SUBJECTIVE STATEMENT: Pt c/o neck/upper back pain that radiates into her L shoulder/upper arm.    She states she recently met with a surgeon to discuss having breast reduction surgery.  Pt states she is high risk bc of her complex medical hx including blood clotting concerns.  She had h/o CVA ~8 years ago.  She is nervous/apprehensive about surgery.  She has been having a lot of neck pain and it radiates into her L shoulder/upper arm to the elbow and sometimes into the R neck.   She attributes the neck pain to the weight of her breasts.  She has discomfort from her bra strap pressing down on her shoulders too.  She is having difficulty finding a bra that is comfortable.  She is retired and stays busy at home with housework.  She enjoys water color painting/penciling.  Functionally she is having difficulty lifting heavy pots in kitchen, folding a big load of laundry, changing sheets, lifting her 8 lb dog (Yorkie), driving, drying herself after showering because of her neck and L shoulder pain.  She is right hand dominant.  She would like to try PT before considering moving forward with surgery and is hopeful it will help her feel better.   PERTINENT HISTORY:  Pt notes h/o partial TKA, OA and psoriatic arthritis, low back pain, previous CVA.  See PMH for full list.    PAIN:  Are you having pain?  4-5/10 neck and L shoulder  PRECAUTIONS: None  WEIGHT BEARING RESTRICTIONS: No  FALLS:  Has patient fallen in last 6 months? No  LIVING ENVIRONMENT: Lives with: lives with their spouse Lives in: House/apartment Stairs:  deferred Has following equipment at home:  n/a today  OCCUPATION: retired  PLOF: Independent  PATIENT GOALS: to be able to perform her activities in her home (cooking, cleaning, hobbies) without being limited by her neck/upper back and L shoulder pain; also to be able to turn her head better while driving  NEXT MD VISIT: 2 months  OBJECTIVE:   DIAGNOSTIC FINDINGS:  No imaging reported  PATIENT SURVEYS:  FOTO 39/48  COGNITION: Overall cognitive status: Within functional limits for tasks assessed  SENSATION: Light touch: Impaired  L C6-8 dermatomes  POSTURE: rounded shoulders and forward head  PALPATION: Pt TTP with palpable trigger points b/l UT, infraspinatus, deltoid; L biceps/pec major   CERVICAL ROM:   Active ROM A/PROM (deg) eval  Flexion 20*  Extension 15*  Right lateral flexion   Left lateral flexion   Right rotation 20   Left rotation 15*   (Blank rows = not tested)  UPPER EXTREMITY ROM:  Active ROM Right eval Left eval  Shoulder flexion 110 105*  Shoulder extension    Shoulder abduction 110 100*  Shoulder adduction    Shoulder extension    Shoulder internal rotation    Shoulder external rotation    Elbow flexion    Elbow extension    Wrist flexion    Wrist extension    Wrist ulnar deviation    Wrist radial deviation    Wrist pronation    Wrist supination     (Blank rows = not tested)  UPPER EXTREMITY MMT:  MMT Right eval Left eval  Shoulder flexion 4 3+*  Shoulder extension    Shoulder abduction 4 3+*  Shoulder adduction    Shoulder extension    Shoulder internal rotation    Shoulder external rotation 4 3+*  Middle trapezius    Lower trapezius    Elbow flexion 4 4  Elbow extension 4 4  Wrist flexion    Wrist extension 4 4  Wrist ulnar deviation    Wrist radial deviation    Thumb ext 4 4  Finger Abd 4 4  Grip strength     (Blank rows = not tested)  CERVICAL SPECIAL TESTS:  Upper limb tension test (ULTT): deferred as pt reports lying supine is too difficult/uncomfortable today + Spurling In tact normal reflexes b/l biceps/triceps/brachioradialis  FUNCTIONAL TESTS:  Reach behind back: R thumb to T9, L thumb to L1   TODAY'S TREATMENT:                                                                                                                              DATE:  Subjective: Pt states she saw her PCP and began antibiotics for her sinus infection; also has ear infection- less drainage than earlier this week. She is starting to feel better but overall hasn't been as active this week.  She feels like PT is helping her neck and shoulder.  She reports compliance with her HEP.   Pain: none reported upon arrival           Therapeutic Exercise:  In seated position today as pt has sinus issues and cannot lie supine- Isometric ER/IR with pt sitting upright and PT resistance: 5  second holds x10 ea direction "Stat[place] of liberty" 3x7, x7 with scapular assist MWM Manual resisted elbow extension with PT Pull/push with wand and PT resistance: 2x10 ea direction Bicep curl 2# weights: x8, x10 Cervical spine rotation R x5, L x5, flexion x5 (2 sets)  Manual Therapy: In seated position- STM L UT/lev scap, manual stretch  STM L  infraspinatus/teres minor/major MWM for L cervical rotation 2x5    Not today: Scapular retraction with ER 2x10 Standing wall slide shoulder flexion AAROM 2x5- not today (HEP) Standing palm press into wall 5 second x 10 (was too uncomfortable for her low back/hip) Standing rows, green TB 2x12- not today (HEP) Supine shoulder flexion AAROM 2x5 with dowel  Updated HEP, see below  PATIENT EDUCATION:  Education details: PT POC/goals, HEP instruction Person educated: Patient Education method: Explanation, Demonstration, and Handouts Education comprehension: tactile cues required and needs further education  HOME EXERCISE PROGRAM: Access Code: [email or web address] URL: [email or web address] Date: 04/17/2022 Prepared by: Merdis Delay  Exercises - Shoulder Flexion Wall Slide with Towel  - 2 x daily - 7 x weekly - 2 sets - 5 reps - Seated Scapular Retraction with External Rotation  - 2 x daily - 7 x weekly - 2 sets - 5 reps - Seated Shoulder Row with Anchored Resistance  - 1 x daily - 7 x weekly - 2 sets - 12 reps - Supine Shoulder Flexion with Dowel  - 1 x daily - 7 x weekly - 1 sets - 10 reps - 5 hold - Standing Bicep Curls Supinated with Dumbbells  - 1 x daily - 3 x weekly - 2 sets - 8-10 reps - Standing Isometric Shoulder Internal Rotation at Doorway  - 1 x daily - 3 x weekly - 1 sets - 10 reps - 5 hold - Standing Isometric Shoulder External Rotation with Doorway  - 1 x daily - 3 x weekly - 1 sets - 10 reps - 5 hold   ASSESSMENT:  CLINICAL IMPRESSION: Pt notes less "tightness" at end range L cervical rotation during and after C2  MWM for rotation.  Also notes less "tightness" in L shoulder flexion AROM end range with shoulder/scapular MWM (scapular upward rot, GH inf glide). Tolerated progression of strengthening with 2# weights for biceps curl very well.  Added this to HEP updated overall HEP.  She is an appropriate candidate for continued skilled PT to address the impairments and functional limitations below.     OBJECTIVE IMPAIRMENTS: decreased activity tolerance, decreased mobility, decreased ROM, and decreased strength.   ACTIVITY LIMITATIONS: carrying, lifting, reach over head, pulling, pushing  PARTICIPATION LIMITATIONS: meal prep, cleaning, laundry, driving, community activity, and yard work  PERSONAL FACTORS: Age, Time since onset of injury/illness/exacerbation, and 1-2 comorbidities: DDD/mammary hypertrophy  are also affecting patient's functional outcome.   REHAB POTENTIAL: Good  CLINICAL DECISION MAKING: Stable/uncomplicated  EVALUATION COMPLEXITY: Low   GOALS: Goals reviewed with patient? Yes  SHORT TERM GOALS: Target date: 04/08/22  Pt will demonstrate ability to perform scapular retraction for postural mm training without PT tactile cues Baseline: unable Goal status: INITIAL   LONG TERM GOALS: Target date: 05/06/22  Improve FOTO to 48 indicating pt able to perform her housework (like vacuuming/carrying heavy pot in kitchen) without being limited by neck and upper back pain Baseline: 39 Goal status: INITIAL  2.  Improve L shoulder mm strength to >4/5 to promote improved ability to lift and carry heavy loads of laundry Baseline: 3+/5 Goal status: INITIAL  3.  Pt will be able independent with a HEP for postural mm/spine mm/shoulder mm strengthening to promote improved ability to work on house chores without being limited by pain Baseline: not performing a strengthening program Goal status: INITIAL  4.  Improve cervical spine AROM 5 deg all directions to improve ability to turn head while  driving Baseline: L rot limited to  15 deg Goal status: INITIAL  PLAN:  PT FREQUENCY: 1-2x/week  PT DURATION: 6 weeks  PLANNED INTERVENTIONS: Therapeutic exercises, Therapeutic activity, Neuromuscular re-education, Balance training, Gait training, Patient/Family education, Self Care, and Joint mobilization  PLAN FOR NEXT SESSION: postural mm retraining; continue with cervical/shoulder ROM, scapular mm and RTC/UE strengthening; cont with MWM for shoulder flex (scapular assist) and L cervical rotation ROM Merdis Delay, PT, DPT, OCS  [id number]  Pincus Badder, PT 04/17/2022, 12:32 PM

## 2022-04-22 ENCOUNTER — Ambulatory Visit: Payer: Medicare Other

## 2022-04-22 DIAGNOSIS — M546 Pain in thoracic spine: Secondary | ICD-10-CM

## 2022-04-22 DIAGNOSIS — M542 Cervicalgia: Secondary | ICD-10-CM

## 2022-04-22 DIAGNOSIS — M25512 Pain in left shoulder: Secondary | ICD-10-CM

## 2022-04-22 NOTE — Therapy (Signed)
OUTPATIENT PHYSICAL THERAPY CERVICOTHORACIC/SHOULDER TREATMENT   Patient Name: Andrea Fuentes MRN: [id number] DOB:04-02-1950, 72 y.o., female Today's Date: 04/22/2022  END OF SESSION:  PT End of Session - 04/22/22 1117     Visit Number 6    Number of Visits 10    Authorization Type Medicare, PN on visit #10    Authorization Time Period PT course of 6 weeks    PT Start Time 1115    PT Stop Time 1200    PT Time Calculation (min) 45 min    Activity Tolerance Patient tolerated treatment well    Behavior During Therapy WFL for tasks assessed/performed             Past Medical History:  Diagnosis Date   Anxiety    Asthma    no issue for 8 years   Cancer ([place])    squamous cell ca rt forearm   DDD (degenerative disc disease), lumbar    osteo and psoriatic/ hips,legs,knees and shins, finger   Deafness in left ear    Depression    Endometriosis    GERD (gastroesophageal reflux disease)    Hammertoe    right foot   Headache    migraines/ 1 or 2 a year, migraines worse before hysterectomy   History of esophagogastroduodenoscopy (EGD)    HOH (hard of hearing)    bilateral/ aides   Hyperlipidemia    Hypertension    controlled on meds   Low back pain    Lumbar stenosis with neurogenic claudication    Neuromuscular disorder (HCC)    Obesity    Peripheral vascular disease ([place])    Psoriasis    hands and scalp   S/P ear surgery    Stomach ulcer due to nonsteroidal anti-inflammatory drug (NSAID)    Stroke ([place])    3 years ago/ no residual effects   Wears hearing aid in both ears    Past Surgical History:  Procedure Laterality Date   ABDOMINAL HYSTERECTOMY  1990   bilateral oopherectomy   bladder tack     CATARACT EXTRACTION Bilateral    COLONOSCOPY     COLONOSCOPY WITH PROPOFOL N/A 03/27/2015   Procedure: COLONOSCOPY WITH PROPOFOL;  Surgeon: Hulen Luster, MD;  Location: [facility] ENDOSCOPY;  Service: Gastroenterology;  Laterality: N/A;   EXCISION PARTIAL PHALANX  Right 07/26/2015   Procedure: EXCISION PARTIAL PHALANX RIGHT 3RD TOE;  Surgeon: [name], DPM;  Location: [facility];  Service: Podiatry;  Laterality: Right;  WITH LOCAL   HAMMER TOE SURGERY Right 07/26/2015   Procedure: HAMMER TOE CORRECTION RIGHT 4TH TOE;  Surgeon: [name], DPM;  Location: [facility];  Service: Podiatry;  Laterality: Right;   INNER EAR SURGERY Left    as a child/ ear drum   JOINT REPLACEMENT Bilateral    partial knee replacement   kidney stone removal     LIVER BIOPSY     NASAL SEPTOPLASTY W/ TURBINOPLASTY Bilateral 11/09/2020   Procedure: NASAL SEPTOPLASTY WITH INFERIOR TURBINATE REDUCTION;  Surgeon: Margaretha Sheffield, MD;  Location: [facility];  Service: ENT;  Laterality: Bilateral;   TONSILLECTOMY     Patient Active Problem List   Diagnosis Date Noted   Symptomatic mammary hypertrophy 03/15/2022   Back pain 03/15/2022   Neck pain 03/15/2022    PCP: Dr. Ouida Sills, MD  REFERRING PROVIDER: Dr. Audelia Hives, DO  REFERRING DIAG: M54.2, M54.6, N62  THERAPY DIAG:  Neck pain  Thoracic spine pain  Left shoulder pain, unspecified chronicity  Rationale for Evaluation and Treatment: Rehabilitation  ONSET DATE: chronic From Initial Evaluation Note: SUBJECTIVE:                                                                                                                                                                                                         SUBJECTIVE STATEMENT: Pt c/o neck/upper back pain that radiates into her L shoulder/upper arm.    She states she recently met with a surgeon to discuss having breast reduction surgery.  Pt states she is high risk bc of her complex medical hx including blood clotting concerns.  She had h/o CVA ~8 years ago.  She is nervous/apprehensive about surgery.  She has been having a lot of neck pain and it radiates into her L shoulder/upper arm to the elbow and sometimes into the R neck.   She attributes the neck pain to the weight of her breasts.  She has discomfort from her bra strap pressing down on her shoulders too.  She is having difficulty finding a bra that is comfortable.  She is retired and stays busy at home with housework.  She enjoys water color painting/penciling.  Functionally she is having difficulty lifting heavy pots in kitchen, folding a big load of laundry, changing sheets, lifting her 8 lb dog (Yorkie), driving, drying herself after showering because of her neck and L shoulder pain.  She is right hand dominant.  She would like to try PT before considering moving forward with surgery and is hopeful it will help her feel better.   PERTINENT HISTORY:  Pt notes h/o partial TKA, OA and psoriatic arthritis, low back pain, previous CVA.  See PMH for full list.    PAIN:  Are you having pain?  4-5/10 neck and L shoulder  PRECAUTIONS: None  WEIGHT BEARING RESTRICTIONS: No  FALLS:  Has patient fallen in last 6 months? No  LIVING ENVIRONMENT: Lives with: lives with their spouse Lives in: House/apartment Stairs:  deferred Has following equipment at home:  n/a today  OCCUPATION: retired  PLOF: Independent  PATIENT GOALS: to be able to perform her activities in her home (cooking, cleaning, hobbies) without being limited by her neck/upper back and L shoulder pain; also to be able to turn her head better while driving  NEXT MD VISIT: 2 months  OBJECTIVE:   DIAGNOSTIC FINDINGS:  No imaging reported  PATIENT SURVEYS:  FOTO 39/48  COGNITION: Overall cognitive status: Within functional limits for tasks assessed  SENSATION: Light touch: Impaired  L C6-8 dermatomes  POSTURE: rounded shoulders and forward head  PALPATION: Pt TTP with palpable trigger points b/l UT, infraspinatus, deltoid; L biceps/pec major   CERVICAL ROM:   Active ROM A/PROM (deg) eval  Flexion 20*  Extension 15*  Right lateral flexion   Left lateral flexion   Right rotation 20   Left rotation 15*   (Blank rows = not tested)  UPPER EXTREMITY ROM:  Active ROM Right eval Left eval  Shoulder flexion 110 105*  Shoulder extension    Shoulder abduction 110 100*  Shoulder adduction    Shoulder extension    Shoulder internal rotation    Shoulder external rotation    Elbow flexion    Elbow extension    Wrist flexion    Wrist extension    Wrist ulnar deviation    Wrist radial deviation    Wrist pronation    Wrist supination     (Blank rows = not tested)  UPPER EXTREMITY MMT:  MMT Right eval Left eval  Shoulder flexion 4 3+*  Shoulder extension    Shoulder abduction 4 3+*  Shoulder adduction    Shoulder extension    Shoulder internal rotation    Shoulder external rotation 4 3+*  Middle trapezius    Lower trapezius    Elbow flexion 4 4  Elbow extension 4 4  Wrist flexion    Wrist extension 4 4  Wrist ulnar deviation    Wrist radial deviation    Thumb ext 4 4  Finger Abd 4 4  Grip strength     (Blank rows = not tested)  CERVICAL SPECIAL TESTS:  Upper limb tension test (ULTT): deferred as pt reports lying supine is too difficult/uncomfortable today + Spurling In tact normal reflexes b/l biceps/triceps/brachioradialis  FUNCTIONAL TESTS:  Reach behind back: R thumb to T9, L thumb to L1   TODAY'S TREATMENT:                                                                                                                              DATE:  Subjective: Pt she is still dealing with her sinus infection.  She goes to her ENT doctor to get her hearing aide adjusted for L ear.  Still having some dizziness since her sinus infection started so she's using her cane. Overall, her neck and shoulder are moving better.  L hand feels tingly intermittently if sitting and holding her tablet with that hand, she shakes it out and it feels better.  Pain: none reported upon arrival           Therapeutic Exercise:  In seated position today as pt has sinus issues and  cannot lie supine- Isometric ER/IR with pt sitting upright and PT resistance: 5 second holds x10 ea direction "Stat[place]" 3x7 Manual resisted elbow extension with PT Pull/push with wand and PT resistance: 2x10 ea direction Bicep curl 2# weights: x8, x10 Cervical spine rotation R x5, L x5, flexion x5 (2 sets) Scapular retraction with ER "Ws"  2x8, green t-band  Manual Therapy: In seated position- STM L UT/lev scap, manual stretch  STM L infraspinatus/teres minor/major MWM for L cervical rotation 2x5 Assessed grip strength: R 32 lbs (dominant UE), L 26 lbs (nondominant UE)  UQ Neuro Screen: (-) hoffman's  (+) L spurling's/distraction Normal reflexes b/l UE biceps/brachioradialis/triceps 5/5 myotome testing, pt does report L shoulder pain with L abd Decreased sensation to light touch L C5-6 dermatomes  Not today: Scapular retraction with ER 2x10 Standing wall slide shoulder flexion AAROM 2x5- not today (HEP) Standing palm press into wall 5 second x 10 (was too uncomfortable for her low back/hip) Standing rows, green TB 2x12- not today (HEP) Supine shoulder flexion AAROM 2x5 with dowel  Updated HEP, see below  PATIENT EDUCATION:  Education details: PT POC/goals, HEP instruction Person educated: Patient Education method: Explanation, Demonstration, and Handouts Education comprehension: tactile cues required and needs further education  HOME EXERCISE PROGRAM: Access Code: [email or web address] URL: [email or web address] Date: 04/17/2022 Prepared by: Merdis Delay  Exercises - Shoulder Flexion Wall Slide with Towel  - 2 x daily - 7 x weekly - 2 sets - 5 reps - Seated Scapular Retraction with External Rotation  - 2 x daily - 7 x weekly - 2 sets - 5 reps - Seated Shoulder Row with Anchored Resistance  - 1 x daily - 7 x weekly - 2 sets - 12 reps - Supine Shoulder Flexion with Dowel  - 1 x daily - 7 x weekly - 1 sets - 10 reps - 5 hold - Standing Bicep Curls Supinated  with Dumbbells  - 1 x daily - 3 x weekly - 2 sets - 8-10 reps - Standing Isometric Shoulder Internal Rotation at Doorway  - 1 x daily - 3 x weekly - 1 sets - 10 reps - 5 hold - Standing Isometric Shoulder External Rotation with Doorway  - 1 x daily - 3 x weekly - 1 sets - 10 reps - 5 hold   ASSESSMENT:  CLINICAL IMPRESSION: Re-assessed UQ neuro screen today as pt's presentation appears to be more cervical spine related than shoulder at this point.  She demonstrates (+) Spurling's and distraction, (+) diminished sensation, no motor deficits in myotomal distribution and grip strength appear to be within normal age/hand dominance norms.  Unable to lie supine comfortably so deferred ULTT today.  She is an appropriate candidate for continued skilled PT to address the impairments and functional limitations below.     OBJECTIVE IMPAIRMENTS: decreased activity tolerance, decreased mobility, decreased ROM, and decreased strength.   ACTIVITY LIMITATIONS: carrying, lifting, reach over head, pulling, pushing  PARTICIPATION LIMITATIONS: meal prep, cleaning, laundry, driving, community activity, and yard work  PERSONAL FACTORS: Age, Time since onset of injury/illness/exacerbation, and 1-2 comorbidities: DDD/mammary hypertrophy  are also affecting patient's functional outcome.   REHAB POTENTIAL: Good  CLINICAL DECISION MAKING: Stable/uncomplicated  EVALUATION COMPLEXITY: Low   GOALS: Goals reviewed with patient? Yes  SHORT TERM GOALS: Target date: 04/08/22  Pt will demonstrate ability to perform scapular retraction for postural mm training without PT tactile cues Baseline: unable Goal status: INITIAL   LONG TERM GOALS: Target date: 05/06/22  Improve FOTO to 48 indicating pt able to perform her housework (like vacuuming/carrying heavy pot in kitchen) without being limited by neck and upper back pain Baseline: 39 Goal status: INITIAL  2.  Improve L shoulder mm strength to >4/5 to promote improved  ability to lift and carry heavy loads of laundry Baseline: 3+/5 Goal status: INITIAL  3.  Pt will be able independent with a HEP for postural mm/spine mm/shoulder mm strengthening to promote improved ability to work on house chores without being limited by pain Baseline: not performing a strengthening program Goal status: INITIAL  4.  Improve cervical spine AROM 5 deg all directions to improve ability to turn head while driving Baseline: L rot limited to 15 deg Goal status: INITIAL  PLAN:  PT FREQUENCY: 1-2x/week  PT DURATION: 6 weeks  PLANNED INTERVENTIONS: Therapeutic exercises, Therapeutic activity, Neuromuscular re-education, Balance training, Gait training, Patient/Family education, Self Care, and Joint mobilization  PLAN FOR NEXT SESSION: postural mm retraining; continue with cervical/shoulder ROM, scapular mm and RTC/UE strengthening; cont with MWM for shoulder flex (scapular assist) and L cervical rotation ROM  Merdis Delay, PT, DPT, OCS  [id number]  Pincus Badder, PT 04/22/2022, 6:10 PM

## 2022-04-24 ENCOUNTER — Ambulatory Visit: Payer: Medicare Other

## 2022-04-24 DIAGNOSIS — M542 Cervicalgia: Secondary | ICD-10-CM | POA: Diagnosis not present

## 2022-04-24 DIAGNOSIS — M546 Pain in thoracic spine: Secondary | ICD-10-CM

## 2022-04-24 DIAGNOSIS — M25512 Pain in left shoulder: Secondary | ICD-10-CM

## 2022-04-24 NOTE — Therapy (Signed)
OUTPATIENT PHYSICAL THERAPY CERVICOTHORACIC/SHOULDER TREATMENT   Patient Name: Andrea Fuentes MRN: [id number] DOB:Apr 14, 1950, 72 y.o., female Today's Date: 04/24/2022  END OF SESSION:  PT End of Session - 04/24/22 1300     Visit Number 7    Number of Visits 10    Authorization Type Medicare, PN on visit #10    Authorization Time Period PT course of 6 weeks    PT Start Time 1115    PT Stop Time 1200    PT Time Calculation (min) 45 min    Activity Tolerance Patient tolerated treatment well    Behavior During Therapy WFL for tasks assessed/performed             Past Medical History:  Diagnosis Date   Anxiety    Asthma    no issue for 8 years   Cancer ([place])    squamous cell ca rt forearm   DDD (degenerative disc disease), lumbar    osteo and psoriatic/ hips,legs,knees and shins, finger   Deafness in left ear    Depression    Endometriosis    GERD (gastroesophageal reflux disease)    Hammertoe    right foot   Headache    migraines/ 1 or 2 a year, migraines worse before hysterectomy   History of esophagogastroduodenoscopy (EGD)    HOH (hard of hearing)    bilateral/ aides   Hyperlipidemia    Hypertension    controlled on meds   Low back pain    Lumbar stenosis with neurogenic claudication    Neuromuscular disorder (HCC)    Obesity    Peripheral vascular disease ([place])    Psoriasis    hands and scalp   S/P ear surgery    Stomach ulcer due to nonsteroidal anti-inflammatory drug (NSAID)    Stroke ([place])    3 years ago/ no residual effects   Wears hearing aid in both ears    Past Surgical History:  Procedure Laterality Date   ABDOMINAL HYSTERECTOMY  1990   bilateral oopherectomy   bladder tack     CATARACT EXTRACTION Bilateral    COLONOSCOPY     COLONOSCOPY WITH PROPOFOL N/A 03/27/2015   Procedure: COLONOSCOPY WITH PROPOFOL;  Surgeon: Hulen Luster, MD;  Location: [facility] ENDOSCOPY;  Service: Gastroenterology;  Laterality: N/A;   EXCISION PARTIAL PHALANX  Right 07/26/2015   Procedure: EXCISION PARTIAL PHALANX RIGHT 3RD TOE;  Surgeon: [name], DPM;  Location: [facility];  Service: Podiatry;  Laterality: Right;  WITH LOCAL   HAMMER TOE SURGERY Right 07/26/2015   Procedure: HAMMER TOE CORRECTION RIGHT 4TH TOE;  Surgeon: [name], DPM;  Location: [facility];  Service: Podiatry;  Laterality: Right;   INNER EAR SURGERY Left    as a child/ ear drum   JOINT REPLACEMENT Bilateral    partial knee replacement   kidney stone removal     LIVER BIOPSY     NASAL SEPTOPLASTY W/ TURBINOPLASTY Bilateral 11/09/2020   Procedure: NASAL SEPTOPLASTY WITH INFERIOR TURBINATE REDUCTION;  Surgeon: Margaretha Sheffield, MD;  Location: [facility];  Service: ENT;  Laterality: Bilateral;   TONSILLECTOMY     Patient Active Problem List   Diagnosis Date Noted   Symptomatic mammary hypertrophy 03/15/2022   Back pain 03/15/2022   Neck pain 03/15/2022    PCP: Dr. Ouida Sills, MD  REFERRING PROVIDER: Dr. Audelia Hives, DO  REFERRING DIAG: M54.2, M54.6, N62  THERAPY DIAG:  Neck pain  Thoracic spine pain  Left shoulder pain, unspecified chronicity  Rationale for Evaluation and Treatment: Rehabilitation  ONSET DATE: chronic From Initial Evaluation Note: SUBJECTIVE:                                                                                                                                                                                                         SUBJECTIVE STATEMENT: Pt c/o neck/upper back pain that radiates into her L shoulder/upper arm.    She states she recently met with a surgeon to discuss having breast reduction surgery.  Pt states she is high risk bc of her complex medical hx including blood clotting concerns.  She had h/o CVA ~8 years ago.  She is nervous/apprehensive about surgery.  She has been having a lot of neck pain and it radiates into her L shoulder/upper arm to the elbow and sometimes into the R neck.   She attributes the neck pain to the weight of her breasts.  She has discomfort from her bra strap pressing down on her shoulders too.  She is having difficulty finding a bra that is comfortable.  She is retired and stays busy at home with housework.  She enjoys water color painting/penciling.  Functionally she is having difficulty lifting heavy pots in kitchen, folding a big load of laundry, changing sheets, lifting her 8 lb dog (Yorkie), driving, drying herself after showering because of her neck and L shoulder pain.  She is right hand dominant.  She would like to try PT before considering moving forward with surgery and is hopeful it will help her feel better.   PERTINENT HISTORY:  Pt notes h/o partial TKA, OA and psoriatic arthritis, low back pain, previous CVA.  See PMH for full list.    PAIN:  Are you having pain?  4-5/10 neck and L shoulder  PRECAUTIONS: None  WEIGHT BEARING RESTRICTIONS: No  FALLS:  Has patient fallen in last 6 months? No  LIVING ENVIRONMENT: Lives with: lives with their spouse Lives in: House/apartment Stairs:  deferred Has following equipment at home:  n/a today  OCCUPATION: retired  PLOF: Independent  PATIENT GOALS: to be able to perform her activities in her home (cooking, cleaning, hobbies) without being limited by her neck/upper back and L shoulder pain; also to be able to turn her head better while driving  NEXT MD VISIT: 2 months  OBJECTIVE:   DIAGNOSTIC FINDINGS:  No imaging reported  PATIENT SURVEYS:  FOTO 39/48  COGNITION: Overall cognitive status: Within functional limits for tasks assessed  SENSATION: Light touch: Impaired  L C6-8 dermatomes  POSTURE: rounded shoulders and forward head  PALPATION: Pt TTP with palpable trigger points b/l UT, infraspinatus, deltoid; L biceps/pec major   CERVICAL ROM:   Active ROM A/PROM (deg) eval  Flexion 20*  Extension 15*  Right lateral flexion   Left lateral flexion   Right rotation 20   Left rotation 15*   (Blank rows = not tested)  UPPER EXTREMITY ROM:  Active ROM Right eval Left eval  Shoulder flexion 110 105*  Shoulder extension    Shoulder abduction 110 100*  Shoulder adduction    Shoulder extension    Shoulder internal rotation    Shoulder external rotation    Elbow flexion    Elbow extension    Wrist flexion    Wrist extension    Wrist ulnar deviation    Wrist radial deviation    Wrist pronation    Wrist supination     (Blank rows = not tested)  UPPER EXTREMITY MMT:  MMT Right eval Left eval  Shoulder flexion 4 3+*  Shoulder extension    Shoulder abduction 4 3+*  Shoulder adduction    Shoulder extension    Shoulder internal rotation    Shoulder external rotation 4 3+*  Middle trapezius    Lower trapezius    Elbow flexion 4 4  Elbow extension 4 4  Wrist flexion    Wrist extension 4 4  Wrist ulnar deviation    Wrist radial deviation    Thumb ext 4 4  Finger Abd 4 4  Grip strength     (Blank rows = not tested)  CERVICAL SPECIAL TESTS:  Upper limb tension test (ULTT): deferred as pt reports lying supine is too difficult/uncomfortable today + Spurling In tact normal reflexes b/l biceps/triceps/brachioradialis  FUNCTIONAL TESTS:  Reach behind back: R thumb to T9, L thumb to L1   TODAY'S TREATMENT:                                                                                                                              DATE:  Subjective: Pt states she got her hearing aide adjusted and is happy with this.  Overall, she is glad she is doing PT and feels like she is learning what she needs to take care of her shoulder/neck.  Pain: none reported upon arrival           Therapeutic Exercise:  In seated position today as pt has sinus issues and cannot lie supine- Isometric ER/IR with pt sitting upright and PT resistance: 5 second holds x10 ea direction "Statue of liberty" 3x7 holding wood stick Manual resisted elbow extension with  PT Bicep curl 2# weights: 2x12 Cervical spine rotation R x5, L x5, flexion x5 (2 sets) Scapular retraction with row: green tband 2x12  Manual Therapy: In seated position- STM L UT/lev scap, manual stretch  STM L infraspinatus/teres minor/major MWM for L cervical rotation 2x5   Not today: Scapular retraction with ER 2x10 Standing wall slide shoulder flexion AAROM 2x5-  not today (HEP)   Updated HEP, see below  PATIENT EDUCATION:  Education details: PT POC/goals, HEP instruction Person educated: Patient Education method: Explanation, Demonstration, and Handouts Education comprehension: tactile cues required and needs further education  HOME EXERCISE PROGRAM: Access Code: [email or web address] URL: [email or web address] Date: 04/17/2022 Prepared by: Merdis Delay  Exercises - Shoulder Flexion Wall Slide with Towel  - 2 x daily - 7 x weekly - 2 sets - 5 reps - Seated Scapular Retraction with External Rotation  - 2 x daily - 7 x weekly - 2 sets - 5 reps - Seated Shoulder Row with Anchored Resistance  - 1 x daily - 7 x weekly - 2 sets - 12 reps - Supine Shoulder Flexion with Dowel  - 1 x daily - 7 x weekly - 1 sets - 10 reps - 5 hold - Standing Bicep Curls Supinated with Dumbbells  - 1 x daily - 3 x weekly - 2 sets - 8-10 reps - Standing Isometric Shoulder Internal Rotation at Doorway  - 1 x daily - 3 x weekly - 1 sets - 10 reps - 5 hold - Standing Isometric Shoulder External Rotation with Doorway  - 1 x daily - 3 x weekly - 1 sets - 10 reps - 5 hold   ASSESSMENT:  CLINICAL IMPRESSION: Focused on postural mm retraining with emphasis on neutral cervical spine/head position today.  Pt able to perform without c/o increased neck or UE sx.  Overall she tolerates seated and standing exercises better still than lying down positions.  Progressing bicep curl was too difficult to progress today.  She is an appropriate candidate for continued skilled PT to address the impairments and  functional limitations below.     OBJECTIVE IMPAIRMENTS: decreased activity tolerance, decreased mobility, decreased ROM, and decreased strength.   ACTIVITY LIMITATIONS: carrying, lifting, reach over head, pulling, pushing  PARTICIPATION LIMITATIONS: meal prep, cleaning, laundry, driving, community activity, and yard work  PERSONAL FACTORS: Age, Time since onset of injury/illness/exacerbation, and 1-2 comorbidities: DDD/mammary hypertrophy  are also affecting patient's functional outcome.   REHAB POTENTIAL: Good  CLINICAL DECISION MAKING: Stable/uncomplicated  EVALUATION COMPLEXITY: Low   GOALS: Goals reviewed with patient? Yes  SHORT TERM GOALS: Target date: 04/08/22  Pt will demonstrate ability to perform scapular retraction for postural mm training without PT tactile cues Baseline: unable Goal status: INITIAL   LONG TERM GOALS: Target date: 05/06/22  Improve FOTO to 48 indicating pt able to perform her housework (like vacuuming/carrying heavy pot in kitchen) without being limited by neck and upper back pain Baseline: 39 Goal status: INITIAL  2.  Improve L shoulder mm strength to >4/5 to promote improved ability to lift and carry heavy loads of laundry Baseline: 3+/5 Goal status: INITIAL  3.  Pt will be able independent with a HEP for postural mm/spine mm/shoulder mm strengthening to promote improved ability to work on house chores without being limited by pain Baseline: not performing a strengthening program Goal status: INITIAL  4.  Improve cervical spine AROM 5 deg all directions to improve ability to turn head while driving Baseline: L rot limited to 15 deg Goal status: INITIAL  PLAN:  PT FREQUENCY: 1-2x/week  PT DURATION: 6 weeks  PLANNED INTERVENTIONS: Therapeutic exercises, Therapeutic activity, Neuromuscular re-education, Balance training, Gait training, Patient/Family education, Self Care, and Joint mobilization  PLAN FOR NEXT SESSION: postural/shoulder mm  strengthening  Merdis Delay, PT, DPT, OCS  [id number]  Pincus Badder, PT 04/24/2022, 1:13 PM

## 2022-05-06 ENCOUNTER — Ambulatory Visit: Payer: Medicare Other | Attending: Plastic Surgery

## 2022-05-06 DIAGNOSIS — M542 Cervicalgia: Secondary | ICD-10-CM | POA: Insufficient documentation

## 2022-05-06 DIAGNOSIS — M25512 Pain in left shoulder: Secondary | ICD-10-CM | POA: Diagnosis present

## 2022-05-06 DIAGNOSIS — M546 Pain in thoracic spine: Secondary | ICD-10-CM | POA: Diagnosis present

## 2022-05-06 NOTE — Therapy (Signed)
OUTPATIENT PHYSICAL THERAPY CERVICOTHORACIC/SHOULDER TREATMENT   Patient Name: Andrea Fuentes MRN: [id number] DOB:12-04-1950, 72 y.o., female Today's Date: 05/06/2022  END OF SESSION:  PT End of Session - 05/06/22 1111     Visit Number 8    Number of Visits 10    Authorization Type Medicare, PN on visit #10    Authorization Time Period PT course of 6 weeks    PT Start Time 1115    PT Stop Time 1200    PT Time Calculation (min) 45 min    Activity Tolerance Patient tolerated treatment well    Behavior During Therapy WFL for tasks assessed/performed             Past Medical History:  Diagnosis Date   Anxiety    Asthma    no issue for 8 years   Cancer ([place])    squamous cell ca rt forearm   DDD (degenerative disc disease), lumbar    osteo and psoriatic/ hips,legs,knees and shins, finger   Deafness in left ear    Depression    Endometriosis    GERD (gastroesophageal reflux disease)    [place]amm[place]oe    right foot   Headache    migraines/ 1 or 2 a year, migraines worse before hysterectomy   History of esophagogastroduodenoscopy (EGD)    HOH (hard of hearing)    bilateral/ aides   Hyperlipidemia    Hypertension    controlled on meds   Low back pain    Lumbar stenosis with neurogenic claudication    Neuromuscular disorder (HCC)    Obesity    Peripheral vascular disease ([place])    Psoriasis    hands and scalp   S/P ear surgery    Stomach ulcer due to nonsteroidal anti-inflammatory drug (NSAID)    Stroke ([place])    3 years ago/ no residual effects   Wears hearing aid in both ears    Past Surgical History:  Procedure Laterality Date   ABDOMINAL HYSTERECTOMY  1990   bilateral oopherectomy   bladder tack     CATARACT EXTRACTION Bilateral    COLONOSCOPY     COLONOSCOPY WITH PROPOFOL N/A 03/27/2015   Procedure: COLONOSCOPY WITH PROPOFOL;  Surgeon: Hulen Luster, MD;  Location: [facility] ENDOSCOPY;  Service: Gastroenterology;  Laterality: N/A;   EXCISION PARTIAL PHALANX Right  07/26/2015   Procedure: EXCISION PARTIAL PHALANX RIGHT 3RD TOE;  Surgeon: [name], DPM;  Location: [place];  Service: Podiatry;  Laterality: Right;  WITH LOCAL   HAMMER TOE SURGERY Right 07/26/2015   Procedure: HAMMER TOE CORRECTION RIGHT 4TH TOE;  Surgeon: [name], DPM;  Location: [place];  Service: Podiatry;  Laterality: Right;   INNER EAR SURGERY Left    as a child/ ear drum   JOINT REPLACEMENT Bilateral    partial knee replacement   kidney stone removal     LIVER BIOPSY     NASAL SEPTOPLASTY W/ TURBINOPLASTY Bilateral 11/09/2020   Procedure: NASAL SEPTOPLASTY WITH INFERIOR TURBINATE REDUCTION;  Surgeon: Margaretha Sheffield, MD;  Location: [place];  Service: ENT;  Laterality: Bilateral;   TONSILLECTOMY     Patient Active Problem List   Diagnosis Date Noted   Symptomatic mammary hypertrophy 03/15/2022   Back pain 03/15/2022   Neck pain 03/15/2022    PCP: Dr. Ouida Sills, MD  REFERRING PROVIDER: Dr. Audelia Hives, DO  REFERRING DIAG: M54.2, M54.6, N62  THERAPY DIAG:  Neck pain  Thoracic spine pain  Left shoulder pain, unspecified chronicity  Rationale for Evaluation and Treatment: Rehabilitation  ONSET DATE: chronic From Initial Evaluation Note: SUBJECTIVE:                                                                                                                                                                                                         SUBJECTIVE STATEMENT: Pt c/o neck/upper back pain that radiates into her L shoulder/upper arm.    She states she recently met with a surgeon to discuss having breast reduction surgery.  Pt states she is high risk bc of her complex medical hx including blood clotting concerns.  She had h/o CVA ~8 years ago.  She is nervous/apprehensive about surgery.  She has been having a lot of neck pain and it radiates into her L shoulder/upper arm to the elbow and sometimes into the R neck.  She  attributes the neck pain to the weight of her breasts.  She has discomfort from her bra strap pressing down on her shoulders too.  She is having difficulty finding a bra that is comfortable.  She is retired and stays busy at home with housework.  She enjoys water color painting/penciling.  Functionally she is having difficulty lifting heavy pots in kitchen, folding a big load of laundry, changing sheets, lifting her 8 lb dog (Yorkie), driving, drying herself after showering because of her neck and L shoulder pain.  She is right hand dominant.  She would like to try PT before considering moving forward with surgery and is hopeful it will help her feel better.   PERTINENT HISTORY:  Pt notes h/o partial TKA, OA and psoriatic arthritis, low back pain, previous CVA.  See PMH for full list.    PAIN:  Are you having pain?  4-5/10 neck and L shoulder  PRECAUTIONS: None  WEIGHT BEARING RESTRICTIONS: No  FALLS:  Has patient fallen in last 6 months? No  LIVING ENVIRONMENT: Lives with: lives with their spouse Lives in: House/apartment Stairs:  deferred Has following equipment at home:  n/a today  OCCUPATION: retired  PLOF: Independent  PATIENT GOALS: to be able to perform her activities in her home (cooking, cleaning, hobbies) without being limited by her neck/upper back and L shoulder pain; also to be able to turn her head better while driving  NEXT MD VISIT: 2 months  OBJECTIVE:   DIAGNOSTIC FINDINGS:  No imaging reported  PATIENT SURVEYS:  FOTO 39/48  COGNITION: Overall cognitive status: Within functional limits for tasks assessed  SENSATION: Light touch: Impaired  L C6-8 dermatomes  POSTURE: rounded shoulders and forward head  PALPATION: Pt TTP with palpable trigger points b/l UT, infraspinatus, deltoid; L biceps/pec major   CERVICAL ROM:   Active ROM A/PROM (deg) eval  Flexion 20*  Extension 15*  Right lateral flexion   Left lateral flexion   Right rotation 20  Left  rotation 15*   (Blank rows = not tested)  UPPER EXTREMITY ROM:  Active ROM Right eval Left eval  Shoulder flexion 110 105*  Shoulder extension    Shoulder abduction 110 100*  Shoulder adduction    Shoulder extension    Shoulder internal rotation    Shoulder external rotation    Elbow flexion    Elbow extension    Wrist flexion    Wrist extension    Wrist ulnar deviation    Wrist radial deviation    Wrist pronation    Wrist supination     (Blank rows = not tested)  UPPER EXTREMITY MMT:  MMT Right eval Left eval  Shoulder flexion 4 3+*  Shoulder extension    Shoulder abduction 4 3+*  Shoulder adduction    Shoulder extension    Shoulder internal rotation    Shoulder external rotation 4 3+*  Middle trapezius    Lower trapezius    Elbow flexion 4 4  Elbow extension 4 4  Wrist flexion    Wrist extension 4 4  Wrist ulnar deviation    Wrist radial deviation    Thumb ext 4 4  Finger Abd 4 4  Grip strength     (Blank rows = not tested)  CERVICAL SPECIAL TESTS:  Upper limb tension test (ULTT): deferred as pt reports lying supine is too difficult/uncomfortable today + [place] In tact normal reflexes b/l biceps/triceps/brachioradialis  FUNCTIONAL TESTS:  Reach behind back: R thumb to T9, L thumb to L1   TODAY'S TREATMENT:                                                                                                                              DATE:  Subjective: Pt states she is pleased with how she is progressing in PT.  She had an injection in her trochanteric bursa (R LE) last week and this has helped with her leg pain.  She isn't using her SPC today.  She did fall 1x in her bathroom while trying to kill a bug since last visit- no injury, but she had a lot of difficulty getting up from the floor.  She crawled to bedroom bc bathroom floor was too uncomfortable to kneel on.  She noticed she didn't have good leg strength to get up, she had to pull herself up with her  arms using a firm kitchen chair that her husband brought her.  The experience was scary for her.  She is wondering if she can work on some leg strengthening after this episode.  Pain: none reported upon arrival           Therapeutic Exercise:  In seated  position today:  Isometric ER/IR with pt sitting upright and PT resistance: 5 second holds x10 ea direction "Stat[place]" 2# 2x12 Push/pull with PT manual resistance holding stick 2x12 ea Bicep curl 2# weights: 2x12 Cervical spine rotation R x5, L x5, flexion x5 (2 sets) Scapular retraction with row: green tband 2x12 Seated scapular retraction with ER: green tband 2x12  Sit to stand without UE support: 4x5 (standard chair height, with airex cushion)     Pt education regarding benefits of PT to address fear of falling, reducing fall risk, and for fall recovery training.  Also assessed LE strength during today's session.  And discussed safety in home environment (specifically her bathroom) today to reduce fall risk: installing grab bar to step into shower and 1 in shower; removing slippery throw rugs.  Also discussed fall recover mechanics using chair at home to push up onto and why LE strengthening is important for safety and to reduce fall risk.  Hip flexion: R 3+/5, L 4/5, knee extension 4/5 R and 4/5 L 5x STS: 20 seconds from standard chair, with hands on thighs (score: indicates fall risk) Seated hip abd/ER with green band: 2x12      Manual Therapy: not today In seated position- STM L UT/lev scap, manual stretch  STM L infraspinatus/teres minor/major MWM for L cervical rotation 2x5   Not today: Scapular retraction with ER 2x10 Standing wall slide shoulder flexion AAROM 2x5- not today (HEP)     Updated HEP, see below  PATIENT EDUCATION:  Education details: PT POC/goals, HEP instruction Person educated: Patient Education method: Explanation, Demonstration, and Handouts Education comprehension: tactile cues  required and needs further education  HOME EXERCISE PROGRAM: Access Code: [email or web address] URL: [email or web address] Date: 05/06/2022 Prepared by: Merdis Delay  Exercises - Shoulder Flexion Wall Slide with Towel  - 2 x daily - 7 x weekly - 2 sets - 5 reps - Seated Scapular Retraction with External Rotation  - 2 x daily - 7 x weekly - 2 sets - 5 reps - Seated Shoulder Row with Anchored Resistance  - 1 x daily - 7 x weekly - 2 sets - 12 reps - Supine Shoulder Flexion with Dowel  - 1 x daily - 7 x weekly - 1 sets - 10 reps - 5 hold - Standing Bicep Curls Supinated with Dumbbells  - 1 x daily - 3 x weekly - 2 sets - 8-10 reps - Standing Isometric Shoulder Internal Rotation at Doorway  - 1 x daily - 3 x weekly - 1 sets - 10 reps - 5 hold - Standing Isometric Shoulder External Rotation with Doorway  - 1 x daily - 3 x weekly - 1 sets - 10 reps - 5 hold - Shoulder External Rotation and Scapular Retraction with Resistance  - 1 x daily - 3 x weekly - 2 sets - 12 reps - Sit to Stand Without Arm Support  - 1 x daily - 7 x weekly - 4 sets - 5 reps - Seated Hip Abduction with Resistance  - 1 x daily - 3 x weekly - 2 sets - 12 reps  ASSESSMENT:  CLINICAL IMPRESSION: Pt tolerated cervical spine/shoulder and postural mm strengthening well today.  Pt has additional c/o fall at home last week and expressed fear of falling.  Pt spent a portion of today's session addressing this including education regarding benefits of PT to address fear of falling, reducing fall risk, and for fall recovery training.  Also assessed LE strength during today's  session.  And discussed safety in home environment (specifically her bathroom) today to reduce fall risk: installing grab bar to step into shower and 1 in shower; removing slippery throw rugs.  Also discussed fall recover mechanics using chair at home to push up onto and why LE strengthening is important for safety and to reduce fall risk.  She is an appropriate  candidate for continued skilled PT to address the impairments and functional limitations below.     OBJECTIVE IMPAIRMENTS: decreased activity tolerance, decreased mobility, decreased ROM, and decreased strength.   ACTIVITY LIMITATIONS: carrying, lifting, reach over head, pulling, pushing  PARTICIPATION LIMITATIONS: meal prep, cleaning, laundry, driving, community activity, and yard work  PERSONAL FACTORS: Age, Time since onset of injury/illness/exacerbation, and 1-2 comorbidities: DDD/mammary hypertrophy  are also affecting patient's functional outcome.   REHAB POTENTIAL: Good  CLINICAL DECISION MAKING: Stable/uncomplicated  EVALUATION COMPLEXITY: Low   GOALS: Goals reviewed with patient? Yes  SHORT TERM GOALS: Target date: 04/08/22  Pt will demonstrate ability to perform scapular retraction for postural mm training without PT tactile cues Baseline: unable Goal status: INITIAL   LONG TERM GOALS: Target date: 05/06/22  Improve FOTO to 48 indicating pt able to perform her housework (like vacuuming/carrying heavy pot in kitchen) without being limited by neck and upper back pain Baseline: 39 Goal status: INITIAL  2.  Improve L shoulder mm strength to >4/5 to promote improved ability to lift and carry heavy loads of laundry Baseline: 3+/5 Goal status: INITIAL  3.  Pt will be able independent with a HEP for postural mm/spine mm/shoulder mm strengthening to promote improved ability to work on house chores without being limited by pain Baseline: not performing a strengthening program Goal status: INITIAL  4.  Improve cervical spine AROM 5 deg all directions to improve ability to turn head while driving Baseline: L rot limited to 15 deg Goal status: INITIAL  PLAN:  PT FREQUENCY: 1-2x/week  PT DURATION: 6 weeks  PLANNED INTERVENTIONS: Therapeutic exercises, Therapeutic activity, Neuromuscular re-education, Balance training, Gait training, Patient/Family education, Self Care, and  Joint mobilization  PLAN FOR NEXT SESSION: progress note at next session  Merdis Delay, PT, DPT, OCS  XD:[id number]  Pincus Badder, PT 05/06/2022, 5:16 PM

## 2022-05-07 ENCOUNTER — Telehealth: Payer: Self-pay | Admitting: Plastic Surgery

## 2022-05-07 NOTE — Telephone Encounter (Signed)
Pt called and wanted to cancel her apt on 4.9.24 F/u from PT, breast reduction sx 1.26.24, pt stated she thought she did not need the apt. She wanted me to let you know Dr Marla Roe, thank you for everything you have done for her.

## 2022-05-08 ENCOUNTER — Ambulatory Visit: Payer: Medicare Other

## 2022-05-08 DIAGNOSIS — M542 Cervicalgia: Secondary | ICD-10-CM | POA: Diagnosis not present

## 2022-05-08 DIAGNOSIS — M546 Pain in thoracic spine: Secondary | ICD-10-CM

## 2022-05-08 DIAGNOSIS — M25512 Pain in left shoulder: Secondary | ICD-10-CM

## 2022-05-08 NOTE — Therapy (Addendum)
OUTPATIENT PHYSICAL THERAPY CERVICOTHORACIC/SHOULDER TREATMENT/ PROGRESS NOTE   Patient Name: Andrea Fuentes MRN: [id number] DOB:[birth year], 72 y.o., female Today's Date: 05/08/2022  END OF SESSION:  PT End of Session - 05/08/22 1129     Visit Number 9    Number of Visits 10    Authorization Type Medicare, PN on visit #10    Authorization Time Period PT course of 6 weeks    PT Start Time 1120    PT Stop Time 1200    PT Time Calculation (min) 40 min    Activity Tolerance Patient tolerated treatment well    Behavior During Therapy WFL for tasks assessed/performed             Past Medical History:  Diagnosis Date   Anxiety    Asthma    no issue for 8 years   Cancer ([place])    squamous cell ca rt forearm   DDD (degenerative disc disease), lumbar    osteo and psoriatic/ hips,legs,knees and shins, finger   Deafness in left ear    Depression    Endometriosis    GERD (gastroesophageal reflux disease)    Hammertoe    right foot   Headache    migraines/ 1 or 2 a year, migraines worse before hysterectomy   History of esophagogastroduodenoscopy (EGD)    HOH (hard of hearing)    bilateral/ aides   Hyperlipidemia    Hypertension    controlled on meds   Low back pain    Lumbar stenosis with neurogenic claudication    Neuromuscular disorder (H[facility])    Obesity    Peripheral vascular disease ([place])    Psoriasis    hands and scalp   S/P ear surgery    Stomach ulcer due to nonsteroidal anti-inflammatory drug (NSAID)    Stroke ([place])    3 years ago/ no residual effects   Wears hearing aid in both ears    Past Surgical History:  Procedure Laterality Date   ABDOMINAL HYSTERECTOMY  1990   bilateral oopherectomy   bladder tack     CATARACT EXTRACTION Bilateral    COLONOSCOPY     COLONOSCOPY WITH PROPOFOL N/A 03/27/2015   Procedure: COLONOSCOPY WITH PROPOFOL;  Surgeon: Hulen Luster, MD;  Location: [facility] ENDOSCOPY;  Service: Gastroenterology;  Laterality: N/A;   EXCISION  PARTIAL PHALANX Right 07/26/2015   Procedure: EXCISION PARTIAL PHALANX RIGHT 3RD TOE;  Surgeon: [name], DPM;  Location: [place];  Service: Podiatry;  Laterality: Right;  WITH LOCAL   HAMMER TOE SURGERY Right 07/26/2015   Procedure: HAMMER TOE CORRECTION RIGHT 4TH TOE;  Surgeon: [name], DPM;  Location: [place];  Service: Podiatry;  Laterality: Right;   INNER EAR SURGERY Left    as a child/ ear drum   JOINT REPLACEMENT Bilateral    partial knee replacement   kidney stone removal     LIVER BIOPSY     NASAL SEPTOPLASTY W/ TURBINOPLASTY Bilateral 11/09/2020   Procedure: NASAL SEPTOPLASTY WITH INFERIOR TURBINATE REDUCTION;  Surgeon: Margaretha Sheffield, MD;  Location: [place];  Service: ENT;  Laterality: Bilateral;   TONSILLECTOMY     Patient Active Problem List   Diagnosis Date Noted   Symptomatic mammary hypertrophy 03/15/2022   Back pain 03/15/2022   Neck pain 03/15/2022    PCP: Dr. Ouida Sills, MD  REFERRING PROVIDER: Dr. Audelia Hives, DO  REFERRING DIAG: M54.2, M54.6, N62  THERAPY DIAG:  Neck pain  Thoracic spine pain  Left shoulder pain,  unspecified chronicity  Rationale for Evaluation and Treatment: Rehabilitation  ONSET DATE: chronic From Initial Evaluation Note: SUBJECTIVE:                                                                                                                                                                                                         SUBJECTIVE STATEMENT: Pt c/o neck/upper back pain that radiates into her L shoulder/upper arm.    She states she recently met with a surgeon to discuss having breast reduction surgery.  Pt states she is high risk bc of her complex medical hx including blood clotting concerns.  She had h/o CVA ~8 years ago.  She is nervous/apprehensive about surgery.  She has been having a lot of neck pain and it radiates into her L shoulder/upper arm to the elbow and sometimes  into the R neck.  She attributes the neck pain to the weight of her breasts.  She has discomfort from her bra strap pressing down on her shoulders too.  She is having difficulty finding a bra that is comfortable.  She is retired and stays busy at home with housework.  She enjoys water color painting/penciling.  Functionally she is having difficulty lifting heavy pots in kitchen, folding a big load of laundry, changing sheets, lifting her 8 lb dog (Yorkie), driving, drying herself after showering because of her neck and L shoulder pain.  She is right hand dominant.  She would like to try PT before considering moving forward with surgery and is hopeful it will help her feel better.   PERTINENT HISTORY:  Pt notes h/o partial TKA, OA and psoriatic arthritis, low back pain, previous CVA.  See PMH for full list.    PAIN:  Are you having pain?  4-5/10 neck and L shoulder  PRECAUTIONS: None  WEIGHT BEARING RESTRICTIONS: No  FALLS:  Has patient fallen in last 6 months? No  LIVING ENVIRONMENT: Lives with: lives with their spouse Lives in: House/apartment Stairs:  deferred Has following equipment at home:  n/a today  OCCUPATION: retired  PLOF: Independent  PATIENT GOALS: to be able to perform her activities in her home (cooking, cleaning, hobbies) without being limited by her neck/upper back and L shoulder pain; also to be able to turn her head better while driving  NEXT MD VISIT: 2 months  OBJECTIVE:   DIAGNOSTIC FINDINGS:  No imaging reported  PATIENT SURVEYS:  FOTO 39/48  COGNITION: Overall cognitive status: Within functional limits for tasks assessed  SENSATION: Light touch: Impaired  L C6-8 dermatomes  POSTURE: rounded shoulders  and forward head  PALPATION: Pt TTP with palpable trigger points b/l UT, infraspinatus, deltoid; L biceps/pec major   CERVICAL ROM:   Active ROM A/PROM (deg) eval  Flexion 20*  Extension 15*  Right lateral flexion   Left lateral flexion    Right rotation 20  Left rotation 15*   (Blank rows = not tested)  UPPER EXTREMITY ROM:  Active ROM Right eval Left eval  Shoulder flexion 110 105*  Shoulder extension    Shoulder abduction 110 100*  Shoulder adduction    Shoulder extension    Shoulder internal rotation    Shoulder external rotation    Elbow flexion    Elbow extension    Wrist flexion    Wrist extension    Wrist ulnar deviation    Wrist radial deviation    Wrist pronation    Wrist supination     (Blank rows = not tested)  UPPER EXTREMITY MMT:  MMT Right eval Left eval  Shoulder flexion 4 3+*  Shoulder extension    Shoulder abduction 4 3+*  Shoulder adduction    Shoulder extension    Shoulder internal rotation    Shoulder external rotation 4 3+*  Middle trapezius    Lower trapezius    Elbow flexion 4 4  Elbow extension 4 4  Wrist flexion    Wrist extension 4 4  Wrist ulnar deviation    Wrist radial deviation    Thumb ext 4 4  Finger Abd 4 4  Grip strength     (Blank rows = not tested)  CERVICAL SPECIAL TESTS:  Upper limb tension test (ULTT): deferred as pt reports lying supine is too difficult/uncomfortable today + [place] In tact normal reflexes b/l biceps/triceps/brachioradialis  FUNCTIONAL TESTS:  Reach behind back: R thumb to T9, L thumb to L1   TODAY'S TREATMENT:                                                                                                                              DATE:  Subjective: Pt states she overall is feeling ~70% better since starting PT.  She notices her L neck still bothers her with crochet work holding the thread with her L arm.  Propping her arm is helpful.  She is able to lift pots in her kitchen a little easier now.  She expresses feeling very pleased with her progress in PT and she works consistently on her HEP.  She would really like to continue with her HEP independently after today and see how she does on her own with sx management.  She is  thankful she has a few leg exercises to work on too.  She will call to follow up if she needs assistance in the future.    Pain: none reported upon arrival         Objective measures:   Shoulder flexion: 150 deg bilaterally Cervical rotation R 50 deg, L 48 deg Strength: L  shoulder flex 4/5 L shoulder abd 3+/5 less painful L shoulder ER 4/5 (All have improved since initial evaluation measurements)     Therapeutic Exercise:  In seated position today:  Isometric ER/IR with pt sitting upright and PT resistance: 5 second holds x10 ea direction "Stat[place]" 2# 2x12 Push/pull with PT manual resistance holding stick 2x12 ea Bicep curl 2# weights: 2x12 Cervical spine rotation R x5, L x5, flexion x5 (2 sets) Scapular retraction with row: green tband 2x12 Seated scapular retraction with ER: green tband 2x12 Sit to stand without UE support: 4x5 (standard chair height, with airex cushion) Seated hip abd/ER with green band: 2x12    Pt education regarding benefits of PT to address fear of falling, reducing fall risk, and for fall recovery training.  Also assessed LE strength during today's session.  And discussed safety in home environment (specifically her bathroom) today to reduce fall risk: installing grab bar to step into shower and 1 in shower; removing slippery throw rugs.  Also discussed fall recover mechanics using chair at home to push up onto and why LE strengthening is important for safety and to reduce fall risk. Reviewed today.     Reviewed HEP.  PATIENT EDUCATION:  Education details: PT POC/goals, HEP instruction Person educated: Patient Education method: Explanation, Demonstration, and Handouts Education comprehension: tactile cues required and needs further education  HOME EXERCISE PROGRAM: Access Code: [email or web address] URL: [email or web address] Date: 05/06/2022 Prepared by: Merdis Delay  Exercises - Shoulder Flexion Wall Slide with Towel  - 2 x daily -  7 x weekly - 2 sets - 5 reps - Seated Scapular Retraction with External Rotation  - 2 x daily - 7 x weekly - 2 sets - 5 reps - Seated Shoulder Row with Anchored Resistance  - 1 x daily - 7 x weekly - 2 sets - 12 reps - Supine Shoulder Flexion with Dowel  - 1 x daily - 7 x weekly - 1 sets - 10 reps - 5 hold - Standing Bicep Curls Supinated with Dumbbells  - 1 x daily - 3 x weekly - 2 sets - 8-10 reps - Standing Isometric Shoulder Internal Rotation at Doorway  - 1 x daily - 3 x weekly - 1 sets - 10 reps - 5 hold - Standing Isometric Shoulder External Rotation with Doorway  - 1 x daily - 3 x weekly - 1 sets - 10 reps - 5 hold - Shoulder External Rotation and Scapular Retraction with Resistance  - 1 x daily - 3 x weekly - 2 sets - 12 reps - Sit to Stand Without Arm Support  - 1 x daily - 7 x weekly - 4 sets - 5 reps - Seated Hip Abduction with Resistance  - 1 x daily - 3 x weekly - 2 sets - 12 reps  ASSESSMENT:  CLINICAL IMPRESSION: Pt has made good, steady progress with a course of outpatient PT.  Her neck and shoulder ROM and strength have improved and she has met or partially met all PT goals.  She demonstrates the ability to perform her HEP with good technique.  She would like to continue working on her HEP independently, which was one of her goals for PT.  I discussed the benefit of PT to address her balance/lower extremity strength deficits we identified at last session.  She would like to work on her HEP independently and will reach out if she would like to pursue PT for that in the future.  Planning to  DC this course of PT for now.    OBJECTIVE IMPAIRMENTS: decreased activity tolerance, decreased mobility, decreased ROM, and decreased strength.   ACTIVITY LIMITATIONS: carrying, lifting, reach over head, pulling, pushing  PARTICIPATION LIMITATIONS: meal prep, cleaning, laundry, driving, community activity, and yard work  PERSONAL FACTORS: Age, Time since onset of injury/illness/exacerbation,  and 1-2 comorbidities: DDD/mammary hypertrophy  are also affecting patient's functional outcome.   REHAB POTENTIAL: Good  CLINICAL DECISION MAKING: Stable/uncomplicated  EVALUATION COMPLEXITY: Low   GOALS: Goals reviewed with patient? Yes  SHORT TERM GOALS: Target date: 04/08/22  Pt will demonstrate ability to perform scapular retraction for postural mm training without PT tactile cues Baseline: unable; 4/3: pt has met this goal Goal status: MET   LONG TERM GOALS: Target date: 05/06/22  Improve FOTO to 48 indicating pt able to perform her housework (like vacuuming/carrying heavy pot in kitchen) without being limited by neck and upper back pain Baseline: 39; 4/3: pt able to perform these activities with some modifications at home, but overall notes 70% improvement Goal status: Partially met  2.  Improve L shoulder mm strength to >4/5 to promote improved ability to lift and carry heavy loads of laundry Baseline: 3+/5; 4/3: 4/5 L shoulder flex and ER Goal status: Partially met  3.  Pt will be able independent with a HEP for postural mm/spine mm/shoulder mm strengthening to promote improved ability to work on house chores without being limited by pain Baseline: not performing a strengthening program; 4/3: pt has met this goal and has a HEP Goal status: pt has met this goal  4.  Improve cervical spine AROM 5 deg all directions to improve ability to turn head while driving Baseline: L rot limited to 15 deg; 4/3: L rotation improved to 48 degrees Goal status: Pt has met this goal  PLAN:  PT FREQUENCY: 1-2x/week  PT DURATION: 6 weeks  PLANNED INTERVENTIONS: Therapeutic exercises, Therapeutic activity, Neuromuscular re-education, Balance training, Gait training, Patient/Family education, Self Care, and Joint mobilization  PLAN FOR NEXT SESSION: pt would like to continue with her HEP today; she states she is very pleased with her progress in PT so far.  She will call to follow up if  she has difficulty with her HEP or if she would like to pursue additional treatment for her balance/LE strength.  DC this course of PT.  Merdis Delay, PT, DPT, OCS  [phone]  Pincus Badder, PT 05/08/2022, 4:24 PM

## 2022-05-14 ENCOUNTER — Ambulatory Visit: Payer: Medicare Other | Admitting: Plastic Surgery

## 2022-05-15 ENCOUNTER — Other Ambulatory Visit: Payer: Self-pay | Admitting: Internal Medicine

## 2022-05-15 DIAGNOSIS — Z1231 Encounter for screening mammogram for malignant neoplasm of breast: Secondary | ICD-10-CM

## 2022-06-12 ENCOUNTER — Telehealth: Payer: Self-pay

## 2022-06-12 NOTE — Telephone Encounter (Signed)
Called patient to follow up if she wanted to have breast reduction.  She had recently had a stroke that impaired her vision and was advised she is at high risk having a blood clot. Was seen by Dr. Ulice Bold and she shared the same concerns. Patient also had a Caprini score of 10 so there was some concern about her risks for after surgery.

## 2022-06-18 ENCOUNTER — Ambulatory Visit
Admission: RE | Admit: 2022-06-18 | Discharge: 2022-06-18 | Disposition: A | Discharge status: Home / Self Care | Payer: Medicare Other | Source: Ambulatory Visit | Attending: Internal Medicine | Admitting: Internal Medicine

## 2022-06-18 DIAGNOSIS — Z1231 Encounter for screening mammogram for malignant neoplasm of breast: Secondary | ICD-10-CM | POA: Insufficient documentation

## 2022-12-02 ENCOUNTER — Ambulatory Visit: Payer: Medicare Other | Admitting: Urology

## 2022-12-02 ENCOUNTER — Encounter: Payer: Self-pay | Admitting: Urology

## 2022-12-02 VITALS — BP 146/83 | HR 90 | Ht 63.0 in | Wt 155.0 lb

## 2022-12-02 DIAGNOSIS — N3946 Mixed incontinence: Secondary | ICD-10-CM

## 2022-12-02 DIAGNOSIS — R32 Unspecified urinary incontinence: Secondary | ICD-10-CM

## 2022-12-02 LAB — URINALYSIS, COMPLETE
Bilirubin, UA: NEGATIVE
Glucose, UA: NEGATIVE
Nitrite, UA: POSITIVE — AB
Protein,UA: NEGATIVE
RBC, UA: NEGATIVE
Specific Gravity, UA: >1.03 — ABNORMAL HIGH (ref 1.005–1.030)
Urobilinogen, Ur: 0.2 mg/dL (ref 0.2–1.0)
pH, UA: 5.5 (ref 5.0–7.5)

## 2022-12-02 LAB — MICROSCOPIC EXAMINATION

## 2022-12-02 NOTE — Patient Instructions (Signed)

## 2022-12-02 NOTE — Progress Notes (Signed)
12/02/2022 2:59 PM   Andrea Fuentes [birth year] [id number]  Referring provider: Lauro Regulus, MD [address] [facility] [place],  [place] [address]  Chief Complaint  Patient presents with   New Patient (Initial Visit)   Urinary Incontinence    HPI: Was consulted to assess the patient's urinary incontinence.  She has urge incontinence and it can be high-volume.  She leaks with coughing sneezing sometimes with bending lifting and with laughing.  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  She wears 4 heavy pads a day.  She said her stress incontinence is the worst but I felt that her overactive bladder symptoms were very significant  She voids every 60 to 90 minutes during the day and 4-5 times at night.  She reports ankle edema but does not take a diuretic  She had a bladder suspension hysterectomy years ago.  She has had a stroke.  She uses Desitin cream for redness of the perineum and may have an ulcer.  She had a distant kidney stone and denies urinary tract infections.  She has no other neurologic issues   PMH: Past Medical History:  Diagnosis Date   Anxiety    Asthma    no issue for 8 years   Cancer ([facility])    squamous cell ca rt forearm   DDD (degenerative disc disease), lumbar    osteo and psoriatic/ hips,legs,knees and shins, finger   Deafness in left ear    Depression    Endometriosis    GERD (gastroesophageal reflux disease)    Hammertoe    right foot   Headache    migraines/ 1 or 2 a year, migraines worse before hysterectomy   History of esophagogastroduodenoscopy (EGD)    HOH (hard of hearing)    bilateral/ aides   Hyperlipidemia    Hypertension    controlled on meds   Low back pain    Lumbar stenosis with neurogenic claudication    Neuromuscular disorder ([facility])    Obesity    Peripheral vascular disease ([facility])    Psoriasis    hands and scalp   S/P ear surgery    Stomach ulcer due to  nonsteroidal anti-inflammatory drug (NSAID)    Stroke ([facility])    3 years ago/ no residual effects   Wears hearing aid in both ears     Surgical History: Past Surgical History:  Procedure Laterality Date   ABDOMINAL HYSTERECTOMY  1990   bilateral oopherectomy   bladder tack     CATARACT EXTRACTION Bilateral    COLONOSCOPY     COLONOSCOPY WITH PROPOFOL N/A 03/27/2015   Procedure: COLONOSCOPY WITH PROPOFOL;  Surgeon: Wallace Cullens, MD;  Location: [facility] ENDOSCOPY;  Service: Gastroenterology;  Laterality: N/A;   EXCISION PARTIAL PHALANX Right 07/26/2015   Procedure: EXCISION PARTIAL PHALANX RIGHT 3RD TOE;  Surgeon: Gwyneth Revels, DPM;  Location: [facility];  Service: Podiatry;  Laterality: Right;  WITH LOCAL   HAMMER TOE SURGERY Right 07/26/2015   Procedure: HAMMER TOE CORRECTION RIGHT 4TH TOE;  Surgeon: Gwyneth Revels, DPM;  Location: [facility];  Service: Podiatry;  Laterality: Right;   INNER EAR SURGERY Left    as a child/ ear drum   JOINT REPLACEMENT Bilateral    partial knee replacement   kidney stone removal     LIVER BIOPSY     NASAL SEPTOPLASTY W/ TURBINOPLASTY Bilateral 11/09/2020   Procedure: NASAL SEPTOPLASTY WITH INFERIOR TURBINATE  REDUCTION;  Surgeon: Vernie Murders, MD;  Location: [facility];  Service: ENT;  Laterality: Bilateral;   TONSILLECTOMY      Home Medications:  Allergies as of 12/02/2022       Reactions   Penicillin G Swelling   lips   Nsaids Other (See Comments)   Gave a stomach ulcer   Shellfish Allergy Itching   Scallops only - Mouth itching   Iodine Rash   IV dye/ burning at site   Penicillin V Potassium Swelling, Rash        Medication List        Accurate as of December 02, 2022  2:59 PM. If you have any questions, ask your nurse or doctor.          STOP taking these medications    ondansetron 4 MG disintegrating tablet Commonly known as: ZOFRAN-ODT Stopped by: Lorin Picket A [place]       TAKE these medications     acetaminophen 650 MG CR tablet Commonly known as: TYLENOL Take 650 mg by mouth every 8 (eight) hours as needed for pain.   albuterol 108 (90 Base) MCG/ACT inhaler Commonly known as: VENTOLIN HFA Inhale 2 puffs into the lungs every 6 (six) hours as needed for wheezing or shortness of breath.   aspirin EC 81 MG tablet Take 81 mg by mouth daily. Swallow whole.   atorvastatin 80 MG tablet Commonly known as: LIPITOR Take 80 mg by mouth daily. pm   busPIRone 15 MG tablet Commonly known as: BUSPAR Take 1 tablet by mouth 2 (two) times daily.   Centrum Silver 50+Women Tabs Take by mouth daily. am   gabapentin 300 MG capsule Commonly known as: NEURONTIN Take 600 mg by mouth 2 (two) times daily. 1 tab AM, 3 tabs PM   lisinopril 10 MG tablet Commonly known as: ZESTRIL Take 10 mg by mouth daily. pm   Melatonin 10 MG Caps Take by mouth daily. pm   omeprazole 20 MG capsule Commonly known as: PRILOSEC Take 20 mg by mouth daily. pm   polyethylene glycol 17 g packet Commonly known as: MIRALAX / GLYCOLAX Take 17 g by mouth every other day.   traMADol 50 MG tablet Commonly known as: ULTRAM Take 50 mg by mouth 4 (four) times daily. Am,noon,dinner,hs   triamcinolone 55 MCG/ACT Aero nasal inhaler Commonly known as: NASACORT Place 1 spray into the nose daily.   venlafaxine 75 MG tablet Commonly known as: EFFEXOR Take 75 mg by mouth 2 (two) times daily. am   VITAMIN D PO Take by mouth daily.        Allergies:  Allergies  Allergen Reactions   Penicillin G Swelling    lips   Nsaids Other (See Comments)    Gave a stomach ulcer   Shellfish Allergy Itching    Scallops only - Mouth itching   Iodine Rash    IV dye/ burning at site   Penicillin V Potassium Swelling and Rash    Family History: Family History  Problem Relation Age of Onset   Heart disease Mother    Heart attack Father    Heart disease Father    Alcohol abuse Brother    Migraines Daughter    Breast  cancer Neg Hx     Social History:  reports that she has never smoked. She has never been exposed to tobacco smoke. She has never used smokeless tobacco. She reports that she does not drink alcohol and does not use drugs.  ROS:  Physical Exam: BP (!) 146/83   Pulse 90   Ht 5\' 3"  (1.6 m)   Wt 70.3 kg   BMI 27.46 kg/m   Constitutional:  Alert and oriented, No acute distress. HEENT: [place] AT, moist mucus membranes.  Trachea midline, no masses. Cardiovascular: No clubbing, cyanosis, or edema. Respiratory: Normal respiratory effort, no increased work of breathing. GI: Abdomen is soft, nontender, nondistended, no abdominal masses GU: On pelvic examination she had a narrow introitus.  No prolapse or stress incontinence with moderate cough.  Vaginal access would be limited for a sling but not for a bulking agent Skin: No rashes, bruises or suspicious lesions. Lymph: No cervical or inguinal adenopathy. Neurologic: Grossly intact, no focal deficits, moving all 4 extremities. Psychiatric: Normal mood and affect.  Laboratory Data: No results found for: "WBC", "HGB", "HCT", "MCV", "PLT"  Lab Results  Component Value Date   CREATININE 0.73 05/14/2012    No results found for: "PSA"  No results found for: "TESTOSTERONE"  No results found for: "HGBA1C"  Urinalysis No results found for: "COLORURINE", "APPEARANCEUR", "LABSPEC", "PHURINE", "GLUCOSEU", "HGBUR", "BILIRUBINUR", "KETONESUR", "PROTEINUR", "UROBILINOGEN", "NITRITE", "LEUKOCYTESUR"  Pertinent Imaging: Urine reviewed and sent for culture.  Chart reviewed  Assessment & Plan: Patient has mixed incontinence and high-volume foot on the floor syndrome.  She will return for cystoscopy and urodynamics.  She has frequency and nocturia.  Call if culture positive.  She likely has primarily an overactive bladder also in keeping with a physical examination.  Patient does not think her  distant stroke that was asymptomatic about 5 years ago was related to her symptoms  1. Urinary incontinence, unspecified type  - Urinalysis, Complete   No follow-ups on file.  Martina Sinner, MD  [facility] [address], [address] [place], [place] [address] [phone]

## 2022-12-24 ENCOUNTER — Ambulatory Visit
Admission: RE | Admit: 2022-12-24 | Discharge: 2022-12-24 | Disposition: A | Discharge status: Home / Self Care | Payer: Medicare Other | Attending: Otolaryngology | Admitting: Otolaryngology

## 2022-12-24 ENCOUNTER — Other Ambulatory Visit: Payer: Self-pay | Admitting: Otolaryngology

## 2022-12-24 ENCOUNTER — Ambulatory Visit
Admission: RE | Admit: 2022-12-24 | Discharge: 2022-12-24 | Disposition: A | Discharge status: Home / Self Care | Payer: Medicare Other | Source: Ambulatory Visit | Attending: Otolaryngology | Admitting: Otolaryngology

## 2022-12-24 DIAGNOSIS — R059 Cough, unspecified: Secondary | ICD-10-CM | POA: Diagnosis present

## 2023-01-13 ENCOUNTER — Other Ambulatory Visit: Payer: Medicare Other | Admitting: Urology

## 2023-03-17 ENCOUNTER — Ambulatory Visit: Payer: Medicare Other | Admitting: Urology

## 2023-03-17 DIAGNOSIS — N3946 Mixed incontinence: Secondary | ICD-10-CM | POA: Diagnosis not present

## 2023-03-17 DIAGNOSIS — R32 Unspecified urinary incontinence: Secondary | ICD-10-CM

## 2023-03-17 LAB — URINALYSIS, COMPLETE
Bilirubin, UA: NEGATIVE
Glucose, UA: NEGATIVE
Nitrite, UA: NEGATIVE
Protein,UA: NEGATIVE
RBC, UA: NEGATIVE
Specific Gravity, UA: >1.03 — ABNORMAL HIGH (ref 1.005–1.030)
Urobilinogen, Ur: 0.2 mg/dL (ref 0.2–1.0)
pH, UA: 5 (ref 5.0–7.5)

## 2023-03-17 LAB — MICROSCOPIC EXAMINATION
Bacteria, UA: NONE SEEN
RBC, Urine: NONE SEEN /[HPF] (ref 0–2)

## 2023-03-17 MED ORDER — GEMTESA 75 MG PO TABS
75.0000 mg | ORAL_TABLET | Freq: Every day | ORAL | 0 refills | Status: AC
Start: 2023-03-17 — End: ?

## 2023-03-17 NOTE — Progress Notes (Signed)
 03/17/2023 2:35 PM   Andrea Fuentes November 05, 1950 [id number]  Referring provider: [name], MD [address] [facility] [place],  [place] [address]  No chief complaint on file.   HPI: I was consulted to assess the patient's urinary incontinence.  She has urge incontinence and it can be high-volume.  She leaks with coughing sneezing sometimes with bending lifting and with laughing.  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  She wears 4 heavy pads a day.  She said her stress incontinence is the worst but I felt that her overactive bladder symptoms were very significant   She voids every 60 to 90 minutes during the day and 4-5 times at night.  She reports ankle edema but does not take a diuretic   She had a bladder suspension hysterectomy years ago.  She has had a stroke.   She uses Desitin cream for redness of the perineum and may have an ulcer.  On pelvic examination she had a narrow introitus. No prolapse or stress incontinence with moderate cough. 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  She will return for cystoscopy and urodynamics.  She has frequency and nocturia.  Call if culture positive.  She likely has primarily an overactive bladder also in keeping with a physical examination.  Patient does not think her distant stroke that was asymptomatic about 5 years ago was related to her symptoms   Today Frequency stable.  Incontinence stable.  No recent urine culture During urodynamics patient did not void and was catheterized to 50 mL.  Maximum bladder capacity 265 mL.  She increased bladder sensation.  Bladder stable.  Her cough leak point pressure 100 mL was 69 cm of water with moderate leakage.  Her cough leak point pressure 250 mL was 47 cm of water with moderate leakage.  In my opinion she did generate a  detrusor contraction.  Voiding pressure appears to be approximately 8 to 10 cm of water.  She did not generate a flow.  EMG activity normal.  She did positional changes.  She is allergic to contrast.  [name] felt that she was not generating detrusor contraction.  The details of the urodynamics are signed dictated     PMH: Past Medical History:  Diagnosis Date   Anxiety    Asthma    no issue for 8 years   Cancer (HCC)    squamous cell ca rt forearm   DDD (degenerative disc disease), lumbar    osteo and psoriatic/ hips,legs,knees and shins, finger   Deafness in left ear    Depression    Endometriosis    GERD (gastroesophageal reflux disease)    Hammertoe    right foot   Headache    migraines/ 1 or 2 a year, migraines worse before hysterectomy   History of esophagogastroduodenoscopy (EGD)    HOH (hard of hearing)    bilateral/ aides   Hyperlipidemia    Hypertension    controlled on meds   Low back pain    Lumbar stenosis with neurogenic claudication    Neuromuscular disorder (HCC)    Obesity    Peripheral vascular disease (HCC)    Psoriasis    hands and scalp   S/P ear surgery    Stomach ulcer due to nonsteroidal anti-inflammatory drug (NSAID)  Stroke [facility])    3 years ago/ no residual effects   Wears hearing aid in both ears     Surgical History: Past Surgical History:  Procedure Laterality Date   ABDOMINAL HYSTERECTOMY  1990   bilateral oopherectomy   bladder tack     CATARACT EXTRACTION Bilateral    COLONOSCOPY     COLONOSCOPY WITH PROPOFOL  N/A 03/27/2015   Procedure: COLONOSCOPY WITH PROPOFOL ;  Surgeon: [name], MD;  Location: [facility] END[facility]COPY;  Service: Gastroenterology;  Laterality: N/A;   EXCISION PARTIAL PHALANX Right 07/26/2015   Procedure: EXCISION PARTIAL PHALANX RIGHT 3RD TOE;  Surgeon: [name], DPM;  Location: [facility];  Service: Podiatry;  Laterality: Right;  WITH LOCAL   HAMMER TOE SURGERY Right 07/26/2015   Procedure: HAMMER TOE  CORRECTION RIGHT 4TH TOE;  Surgeon: [name], DPM;  Location: [facility];  Service: Podiatry;  Laterality: Right;   INNER EAR SURGERY Left    as a child/ ear drum   JOINT REPLACEMENT Bilateral    partial knee replacement   kidney stone removal     LIVER BIOPSY     NASAL SEPTOPLASTY W/ TURBINOPLASTY Bilateral 11/09/2020   Procedure: NASAL SEPTOPLASTY WITH INFERIOR TURBINATE REDUCTION;  Surgeon: [name], MD;  Location: [facility];  Service: ENT;  Laterality: Bilateral;   TONSILLECTOMY      Home Medications:  Allergies as of 03/17/2023       Reactions   Penicillin G Swelling   lips   Nsaids Other (See Comments)   Gave a stomach ulcer   Shellfish Allergy Itching   Scallops only - Mouth itching   Iodine Rash   IV dye/ burning at site   Penicillin V Potassium Swelling, Rash        Medication List        Accurate as of March 17, 2023  2:35 PM. If you have any questions, ask your nurse or doctor.          acetaminophen  650 MG CR tablet Commonly known as: TYLENOL  Take 650 mg by mouth every 8 (eight) hours as needed for pain.   albuterol 108 (90 Base) MCG/ACT inhaler Commonly known as: VENTOLIN HFA Inhale 2 puffs into the lungs every 6 (six) hours as needed for wheezing or shortness of breath.   aspirin EC 81 MG tablet Take 81 mg by mouth daily. Swallow whole.   atorvastatin 80 MG tablet Commonly known as: LIPITOR Take 80 mg by mouth daily. pm   busPIRone 15 MG tablet Commonly known as: BUSPAR Take 1 tablet by mouth 2 (two) times daily.   Centrum Silver 50+Women Tabs Take by mouth daily. am   gabapentin 300 MG capsule Commonly known as: NEURONTIN Take 600 mg by mouth 2 (two) times daily. 1 tab AM, 3 tabs PM   lisinopril 10 MG tablet Commonly known as: ZESTRIL Take 10 mg by mouth daily. pm   Melatonin 10 MG Caps Take by mouth daily. pm   omeprazole 20 MG capsule Commonly known as: PRILOSEC Take 20 mg by mouth daily. pm    polyethylene glycol 17 g packet Commonly known as: MIRALAX / GLYCOLAX Take 17 g by mouth every other day.   traMADol 50 MG tablet Commonly known as: ULTRAM Take 50 mg by mouth 4 (four) times daily. Am,noon,dinner,hs   triamcinolone 55 MCG/ACT Aero nasal inhaler Commonly known as: NASACORT Place 1 spray into the nose daily.   venlafaxine 75 MG tablet Commonly known as: EFFEXOR Take  75 mg by mouth 2 (two) times daily. am   VITAMIN D PO Take by mouth daily.        Allergies:  Allergies  Allergen Reactions   Penicillin G Swelling    lips   Nsaids Other (See Comments)    Gave a stomach ulcer   Shellfish Allergy Itching    Scallops only - Mouth itching   Iodine Rash    IV dye/ burning at site   Penicillin V Potassium Swelling and Rash    Family History: Family History  Problem Relation Age of Onset   Heart disease Mother    Heart attack Father    Heart disease Father    Alcohol abuse Brother    Migraines Daughter    Breast cancer Neg Hx     Social History:  reports that she has never smoked. She has never been exposed to tobacco smoke. She has never used smokeless tobacco. She reports that she does not drink alcohol and does not use drugs.  ROS:                                        Physical Exam: There were no vitals taken for this visit.  Constitutional:  Alert and oriented, No acute distress. HEENT: [place] AT, moist mucus membranes.  Trachea midline, no masses.   Laboratory Data: No results found for: "WBC", "HGB", "HCT", "MCV", "PLT"  Lab Results  Component Value Date   CREATININE 0.73 05/14/2012    No results found for: "PSA"  No results found for: "TESTOSTERONE"  No results found for: "HGBA1C"  Urinalysis    Component Value Date/Time   APPEARANCEUR Hazy (A) 12/02/2022 1439   GLUCOSEU Negative 12/02/2022 1439   BILIRUBINUR Negative 12/02/2022 1439   PROTEINUR Negative 12/02/2022 1439   NITRITE Positive (A)  12/02/2022 1439   LEUKOCYTESUR Trace (A) 12/02/2022 1439    Pertinent Imaging:   Assessment & Plan: Patient has mixed incontinence with moderately low leak point pressures.  Patient knows a both are moderately severe she has an overactive bladder with urge incontinence and foot on the floor syndrome.  She does not reach her treatment goal with medical behavioral therapy I would offer her a bulking agent.  Patient could live with her stress incontinence I would offer her third line OAB therapy.  Reassess in 6 weeks on Gemtesa  samples and prescription.  Call if urine culture positive  Again the more I hear history the high-volume flooding episodes in my opinion are from her overactive bladder and not really point pressures.  Cystoscopy next visit.  Call if culture positive  1. Urinary incontinence, unspecified type (Primary)  - Urinalysis, Complete  2. Mixed incontinence  - Urinalysis, Complete   No follow-ups on file.  Devorah Fonder, MD  [facility] [address] [place], [place] [address] [phone]

## 2023-03-20 ENCOUNTER — Other Ambulatory Visit: Payer: Self-pay

## 2023-03-20 MED ORDER — NITROFURANTOIN MACROCRYSTAL 100 MG PO CAPS: 100.0000 mg | ORAL_CAPSULE | Freq: Two times a day (BID) | ORAL | 0 refills | Status: AC

## 2023-03-23 LAB — CULTURE, URINE COMPREHENSIVE

## 2023-05-05 ENCOUNTER — Ambulatory Visit: Payer: Medicare Other | Admitting: Urology

## 2023-05-05 DIAGNOSIS — N3946 Mixed incontinence: Secondary | ICD-10-CM | POA: Diagnosis not present

## 2023-05-05 DIAGNOSIS — R32 Unspecified urinary incontinence: Secondary | ICD-10-CM

## 2023-05-05 LAB — URINALYSIS, COMPLETE
Bilirubin, UA: NEGATIVE
Glucose, UA: NEGATIVE
Ketones, UA: NEGATIVE
Leukocytes,UA: NEGATIVE
Nitrite, UA: NEGATIVE
Protein,UA: NEGATIVE
RBC, UA: NEGATIVE
Specific Gravity, UA: 1.02 (ref 1.005–1.030)
Urobilinogen, Ur: 0.2 mg/dL (ref 0.2–1.0)
pH, UA: 5.5 (ref 5.0–7.5)

## 2023-05-05 LAB — MICROSCOPIC EXAMINATION

## 2023-05-05 MED ORDER — OXYBUTYNIN CHLORIDE ER 10 MG PO TB24: 10.0000 mg | ORAL_TABLET | Freq: Every day | ORAL | 11 refills | Status: DC

## 2023-05-05 NOTE — Progress Notes (Signed)
 05/05/2023 2:06 PM   Andrea Fuentes 1950-03-14 [id number]  Referring provider: Lauro Regulus, MD [address] [facility] [place],  [place] [address]  No chief complaint on file.   HPI: I was consulted to assess the patient's urinary incontinence.  She has urge incontinence and it can be high-volume.  She leaks with coughing sneezing sometimes with bending lifting and with laughing.  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  She wears 4 heavy pads a day.  She said her stress incontinence is the worst but I felt that her overactive bladder symptoms were very significant   She voids every 60 to 90 minutes during the day and 4-5 times at night.  She reports ankle edema but does not take a diuretic   She had a bladder suspension hysterectomy years ago.  She has had a stroke.   She uses Desitin cream for redness of the perineum and may have an ulcer.   On pelvic examination she had a narrow introitus. No prolapse or stress incontinence with moderate cough. 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  She will return for cystoscopy and urodynamics.  She has frequency and nocturia.  Call if culture positive.  She likely has primarily an overactive bladder also in keeping with a physical examination.  Patient does not think her distant stroke that was asymptomatic about 5 years ago was related to her symptoms     No recent urine culture During urodynamics patient did not void and was catheterized to 50 mL.  Maximum bladder capacity 265 mL.  She increased bladder sensation.  Bladder stable.  Her cough leak point pressure 100 mL was 69 cm of water with moderate leakage.  Her cough leak point pressure 250 mL was 47 cm of water with moderate leakage.  In my opinion she did generate a detrusor contraction.  Voiding pressure  appears to be approximately 8 to 10 cm of water.  She did not generate a flow.  EMG activity normal.  She did positional changes.  She is allergic to contrast.  Asher Muir felt that she was not generating detrusor contraction.    Patient has mixed incontinence with moderately low leak point pressures.  Patient knows a both are moderately severe she has an overactive bladder with urge incontinence and foot on the floor syndrome.  She does not reach her treatment goal with medical behavioral therapy I would offer her a bulking agent.  Patient could live with her stress incontinence I would offer her third line OAB therapy.  Reassess in 6 weeks on Gemtesa samples and prescription.  Call if urine culture positive   Again the more I hear history the high-volume flooding episodes in my opinion are from her overactive bladder and not lower point pressures.  Cystoscopy next visit.  Call if culture positive  Today Frequency stable.  Last culture positive Patient states she was more than 50% better on Gemtesa but to think it upset her bowel causing more bowel incontinence.  It was also $300. Cystoscopy: Patient underwent flexible cystoscopy.  Bladder mucosa and trigone were normal.  Initially I thought she may have mild changes of cystitis cystica but the exam was within normal limits.  Urine was little bit cloudy.  Urine was aspirated and sent for culture Again she had  a fixed bladder neck with narrow introitus and no stress incontinence.    PMH: Past Medical History:  Diagnosis Date   Anxiety    Asthma    no issue for 8 years   Cancer (HCC)    squamous cell ca rt forearm   DDD (degenerative disc disease), lumbar    osteo and psoriatic/ hips,legs,knees and shins, finger   Deafness in left ear    Depression    Endometriosis    GERD (gastroesophageal reflux disease)    Hammertoe    right foot   Headache    migraines/ 1 or 2 a year, migraines worse before hysterectomy   History of  esophagogastroduodenoscopy (EGD)    HOH (hard of hearing)    bilateral/ aides   Hyperlipidemia    Hypertension    controlled on meds   Low back pain    Lumbar stenosis with neurogenic claudication    Neuromuscular disorder (HCC)    Obesity    Peripheral vascular disease (HCC)    Psoriasis    hands and scalp   S/P ear surgery    Stomach ulcer due to nonsteroidal anti-inflammatory drug (NSAID)    Stroke (HCC)    3 years ago/ no residual effects   Wears hearing aid in both ears     Surgical History: Past Surgical History:  Procedure Laterality Date   ABDOMINAL HYSTERECTOMY  1990   bilateral oopherectomy   bladder tack     CATARACT EXTRACTION Bilateral    COLONOSCOPY     COLONOSCOPY WITH PROPOFOL N/A 03/27/2015   Procedure: COLONOSCOPY WITH PROPOFOL;  Surgeon: Wallace Cullens, MD;  Location: [facility];  Service: Gastroenterology;  Laterality: N/A;   EXCISION PARTIAL PHALANX Right 07/26/2015   Procedure: EXCISION PARTIAL PHALANX RIGHT 3RD TOE;  Surgeon: Gwyneth Revels, DPM;  Location: [facility];  Service: Podiatry;  Laterality: Right;  WITH LOCAL   HAMMER TOE SURGERY Right 07/26/2015   Procedure: HAMMER TOE CORRECTION RIGHT 4TH TOE;  Surgeon: Gwyneth Revels, DPM;  Location: [facility];  Service: Podiatry;  Laterality: Right;   INNER EAR SURGERY Left    as a child/ ear drum   JOINT REPLACEMENT Bilateral    partial knee replacement   kidney stone removal     LIVER BIOPSY     NASAL SEPTOPLASTY W/ TURBINOPLASTY Bilateral 11/09/2020   Procedure: NASAL SEPTOPLASTY WITH INFERIOR TURBINATE REDUCTION;  Surgeon: Vernie Murders, MD;  Location: [facility];  Service: ENT;  Laterality: Bilateral;   TONSILLECTOMY      Home Medications:  Allergies as of 05/05/2023       Reactions   Penicillin G Swelling   lips   Nsaids Other (See Comments)   Gave a stomach ulcer   Shellfish Allergy Itching   Scallops only - Mouth itching   Iodine Rash   IV dye/ burning at site    Penicillin V Potassium Swelling, Rash        Medication List        Accurate as of May 05, 2023  2:06 PM. If you have any questions, ask your nurse or doctor.          acetaminophen 650 MG CR tablet Commonly known as: TYLENOL Take 650 mg by mouth every 8 (eight) hours as needed for pain.   albuterol 108 (90 Base) MCG/ACT inhaler Commonly known as: VENTOLIN HFA Inhale 2 puffs into the lungs every 6 (six) hours as needed for wheezing or shortness of breath.  aspirin EC 81 MG tablet Take 81 mg by mouth daily. Swallow whole.   atorvastatin 80 MG tablet Commonly known as: LIPITOR Take 80 mg by mouth daily. pm   busPIRone 15 MG tablet Commonly known as: BUSPAR Take 1 tablet by mouth 2 (two) times daily.   Centrum Silver 50+Women Tabs Take by mouth daily. am   gabapentin 300 MG capsule Commonly known as: NEURONTIN Take 600 mg by mouth 2 (two) times daily. 1 tab AM, 3 tabs PM   Gemtesa 75 MG Tabs Generic drug: Vibegron Take 1 tablet (75 mg total) by mouth daily.   lisinopril 10 MG tablet Commonly known as: ZESTRIL Take 10 mg by mouth daily. pm   Melatonin 10 MG Caps Take by mouth daily. pm   nitrofurantoin 100 MG capsule Commonly known as: MACRODANTIN Take 1 capsule (100 mg total) by mouth 2 (two) times daily.   omeprazole 20 MG capsule Commonly known as: PRILOSEC Take 20 mg by mouth daily. pm   polyethylene glycol 17 g packet Commonly known as: MIRALAX / GLYCOLAX Take 17 g by mouth every other day.   traMADol 50 MG tablet Commonly known as: ULTRAM Take 50 mg by mouth 4 (four) times daily. Am,noon,dinner,hs   triamcinolone 55 MCG/ACT Aero nasal inhaler Commonly known as: NASACORT Place 1 spray into the nose daily.   venlafaxine 75 MG tablet Commonly known as: EFFEXOR Take 75 mg by mouth 2 (two) times daily. am   VITAMIN D PO Take by mouth daily.        Allergies:  Allergies  Allergen Reactions   Penicillin G Swelling    lips   Nsaids  Other (See Comments)    Gave a stomach ulcer   Shellfish Allergy Itching    Scallops only - Mouth itching   Iodine Rash    IV dye/ burning at site   Penicillin V Potassium Swelling and Rash    Family History: Family History  Problem Relation Age of Onset   Heart disease Mother    Heart attack Father    Heart disease Father    Alcohol abuse Brother    Migraines Daughter    Breast cancer Neg Hx     Social History:  reports that she has never smoked. She has never been exposed to tobacco smoke. She has never used smokeless tobacco. She reports that she does not drink alcohol and does not use drugs.  ROS:                                        Physical Exam: There were no vitals taken for this visit.  Constitutional:  Alert and oriented, No acute distress. HEENT: [place] AT, moist mucus membranes.  Trachea midline, no masses.  Laboratory Data: No results found for: "WBC", "HGB", "HCT", "MCV", "PLT"  Lab Results  Component Value Date   CREATININE 0.73 05/14/2012    No results found for: "PSA"  No results found for: "TESTOSTERONE"  No results found for: "HGBA1C"  Urinalysis    Component Value Date/Time   APPEARANCEUR Clear 03/17/2023 1432   GLUCOSEU Negative 03/17/2023 1432   BILIRUBINUR Negative 03/17/2023 1432   PROTEINUR Negative 03/17/2023 1432   NITRITE Negative 03/17/2023 1432   LEUKOCYTESUR Trace (A) 03/17/2023 1432    Pertinent Imaging: Urine reviewed and sent for culture.  Assessment & Plan: Reassess in 6 weeks on oxybutynin ER 10 mg  30 x 11.  I am suspect that her primary problem again is overactive bladder and not stress incontinence.  Call if culture positive.  If culture positive consider prophylaxis.  Patient just got over pneumonia and COVID and is quite weak.  I think we need to have reasonable treatment goals at least in the short-term    1. Urinary incontinence, unspecified type (Primary)  - Urinalysis, Complete  2. Mixed  incontinence  - Urinalysis, Complete   No follow-ups on file.  Martina Sinner, MD  [facility] [address], [address] [place], [place] [address] [phone]

## 2023-05-08 LAB — CULTURE, URINE COMPREHENSIVE

## 2023-06-26 ENCOUNTER — Encounter: Payer: Self-pay | Admitting: Internal Medicine

## 2023-07-14 ENCOUNTER — Ambulatory Visit: Admitting: Urology

## 2023-07-14 VITALS — BP 136/63 | HR 94

## 2023-07-14 DIAGNOSIS — N3946 Mixed incontinence: Secondary | ICD-10-CM | POA: Diagnosis not present

## 2023-07-14 DIAGNOSIS — R32 Unspecified urinary incontinence: Secondary | ICD-10-CM

## 2023-07-14 MED ORDER — SOLIFENACIN SUCCINATE 5 MG PO TABS: 5.0000 mg | ORAL_TABLET | Freq: Every day | ORAL | 11 refills | Status: DC

## 2023-07-14 NOTE — Progress Notes (Signed)
 07/14/2023 2:47 PM   Andrea Fuentes 1950-04-19 [id number]  Referring provider: [name], MD [address] [facility] [place],  [place] [address]  No chief complaint on file.   HPI: Reviewed note Comp[uter issues Oxy helped but much dry mouth No UTI     PMH: Past Medical History:  Diagnosis Date   Anxiety    Asthma    no issue for 8 years   Cancer (HCC)    squamous cell ca rt forearm   Cerebrovascular disease    DDD (degenerative disc disease), lumbar    osteo and psoriatic/ hips,legs,knees and shins, finger   Deafness in left ear    Depression    Endometriosis    GERD (gastroesophageal reflux disease)    Hammertoe    right foot   Headache    migraines/ 1 or 2 a year, migraines worse before hysterectomy   History of esophagogastroduodenoscopy (EGD)    HOH (hard of hearing)    bilateral/ aides   Hyperlipidemia    Hypertension    controlled on meds   Low back pain    Lumbar stenosis with neurogenic claudication    Major depression in remission (HCC)    Neuromuscular disorder (HCC)    Obesity    Peripheral vascular disease (HCC)    Pre-diabetes    Primary osteoarthritis involving multiple joints    Psoriasis    hands and scalp   Psoriatic arthritis (HCC)    S/P ear surgery    Stomach ulcer due to nonsteroidal anti-inflammatory drug (NSAID)    Stroke (HCC)    3 years ago/ no residual effects   Wears hearing aid in both ears     Surgical History: Past Surgical History:  Procedure Laterality Date   ABDOMINAL HYSTERECTOMY  02/05/1988   bilateral oopherectomy   BILATERAL OOPHORECTOMY     bladder tack     CATARACT EXTRACTION Bilateral    COLONOSCOPY     COLONOSCOPY WITH PROPOFOL  N/A 03/27/2015   Procedure: COLONOSCOPY WITH PROPOFOL ;  Surgeon: [name], MD;  Location: [facility] END[facility];  Service: Gastroenterology;  Laterality: N/A;   EXCISION PARTIAL PHALANX Right 07/26/2015   Procedure: EXCISION PARTIAL PHALANX RIGHT  3RD TOE;  Surgeon: [name], DPM;  Location: [facility];  Service: Podiatry;  Laterality: Right;  WITH LOCAL   EYE SURGERY     HAMMER TOE SURGERY Right 07/26/2015   Procedure: HAMMER TOE CORRECTION RIGHT 4TH TOE;  Surgeon: [name], DPM;  Location: [facility];  Service: Podiatry;  Laterality: Right;   INNER EAR SURGERY Left    as a child/ ear drum   JOINT REPLACEMENT Bilateral    partial knee replacement   kidney stone removal     LIVER BIOPSY     NASAL SEPTOPLASTY W/ TURBINOPLASTY Bilateral 11/09/2020   Procedure: NASAL SEPTOPLASTY WITH INFERIOR TURBINATE REDUCTION;  Surgeon: [name], MD;  Location: [facility];  Service: ENT;  Laterality: Bilateral;   TONSILLECTOMY      Home Medications:  Allergies as of 07/14/2023       Reactions   Penicillin G Swelling   lips   Nsaids Other (See Comments)   Gave a stomach ulcer   Shellfish Allergy Itching   Scallops only - Mouth itching   Iodine Rash   IV dye/ burning at site   Penicillin V Potassium Swelling, Rash        Medication List  Accurate as of July 14, 2023  2:47 PM. If you have any questions, ask your nurse or doctor.          STOP taking these medications    oxybutynin  10 MG 24 hr tablet Commonly known as: DITROPAN -XL       TAKE these medications    acetaminophen  650 MG CR tablet Commonly known as: TYLENOL  Take 650 mg by mouth every 8 (eight) hours as needed for pain.   albuterol 108 (90 Base) MCG/ACT inhaler Commonly known as: VENTOLIN HFA Inhale 2 puffs into the lungs every 6 (six) hours as needed for wheezing or shortness of breath.   aspirin EC 81 MG tablet Take 81 mg by mouth daily. Swallow whole.   atorvastatin 80 MG tablet Commonly known as: LIPITOR Take 80 mg by mouth daily. pm   busPIRone 15 MG tablet Commonly known as: BUSPAR Take 1 tablet by mouth 2 (two) times daily.   Centrum Silver 50+Women Tabs Take by mouth daily. am   gabapentin 300  MG capsule Commonly known as: NEURONTIN Take 600 mg by mouth 2 (two) times daily. 1 tab AM, 3 tabs PM   Gemtesa  75 MG Tabs Generic drug: Vibegron  Take 1 tablet (75 mg total) by mouth daily.   lisinopril 10 MG tablet Commonly known as: ZESTRIL Take 10 mg by mouth daily. pm   Melatonin 10 MG Caps Take by mouth daily. pm   montelukast 10 MG tablet Commonly known as: SINGULAIR Take 10 mg by mouth daily.   nitrofurantoin  100 MG capsule Commonly known as: MACRODANTIN  Take 1 capsule (100 mg total) by mouth 2 (two) times daily.   omeprazole 20 MG capsule Commonly known as: PRILOSEC Take 20 mg by mouth daily. pm   polyethylene glycol 17 g packet Commonly known as: MIRALAX / GLYCOLAX Take 17 g by mouth every other day.   solifenacin 5 MG tablet Commonly known as: VESICARE Take 1 tablet (5 mg total) by mouth daily.   traMADol 50 MG tablet Commonly known as: ULTRAM Take 50 mg by mouth 4 (four) times daily. Am,noon,dinner,hs   triamcinolone 55 MCG/ACT Aero nasal inhaler Commonly known as: NASACORT Place 1 spray into the nose daily.   venlafaxine 75 MG tablet Commonly known as: EFFEXOR Take 75 mg by mouth 2 (two) times daily. am   VITAMIN D PO Take by mouth daily.        Allergies:  Allergies  Allergen Reactions   Penicillin G Swelling    lips   Nsaids Other (See Comments)    Gave a stomach ulcer   Shellfish Allergy Itching    Scallops only - Mouth itching   Iodine Rash    IV dye/ burning at site   Penicillin V Potassium Swelling and Rash    Family History: Family History  Problem Relation Age of Onset   Heart disease Mother    Heart attack Father    Heart disease Father    Alcohol abuse Brother    Migraines Daughter    Breast cancer Neg Hx     Social History:  reports that she has never smoked. She has never been exposed to tobacco smoke. She has never used smokeless tobacco. She reports that she does not drink alcohol and does not use  drugs.  ROS:  Physical Exam: BP 136/63   Pulse 94   Constitutional:  Alert and oriented, No acute distress. HEENT: [place] AT, moist mucus membranes.  Trachea midline, no masses. Cardiovascular: No clubbing, cyanosis, or edema.   Laboratory Data: No results found for: "WBC", "HGB", "HCT", "MCV", "PLT"  Lab Results  Component Value Date   CREATININE 0.73 05/14/2012    No results found for: "PSA"  No results found for: "TESTOSTERONE"  No results found for: "HGBA1C"  Urinalysis    Component Value Date/Time   APPEARANCEUR Clear 05/05/2023 1428   GLUCOSEU Negative 05/05/2023 1428   BILIRUBINUR Negative 05/05/2023 1428   PROTEINUR Negative 05/05/2023 1428   NITRITE Negative 05/05/2023 1428   LEUKOCYTESUR Negative 05/05/2023 1428    Pertinent Imaging:   Assessment & Plan:  reassess 6 weeks vesicare   1. Urinary incontinence, unspecified type (Primary)  - Urinalysis, Complete  2. Mixed incontinence  - Urinalysis, Complete   No follow-ups on file.  Devorah Fonder, MD  [facility] [address] [place], [place] [address] [phone]

## 2023-07-22 ENCOUNTER — Ambulatory Visit
Admission: RE | Admit: 2023-07-22 | Discharge: 2023-07-22 | Disposition: A | Discharge status: Home / Self Care | Attending: Internal Medicine | Admitting: Internal Medicine

## 2023-07-22 ENCOUNTER — Ambulatory Visit: Admitting: Anesthesiology

## 2023-07-22 ENCOUNTER — Encounter
Admission: RE | Disposition: A | Discharge status: Home / Self Care | Payer: Self-pay | Source: Home / Self Care | Attending: Internal Medicine

## 2023-07-22 ENCOUNTER — Encounter: Payer: Self-pay | Admitting: Internal Medicine

## 2023-07-22 DIAGNOSIS — K640 First degree hemorrhoids: Secondary | ICD-10-CM | POA: Insufficient documentation

## 2023-07-22 DIAGNOSIS — K5730 Diverticulosis of large intestine without perforation or abscess without bleeding: Secondary | ICD-10-CM | POA: Diagnosis not present

## 2023-07-22 DIAGNOSIS — K219 Gastro-esophageal reflux disease without esophagitis: Secondary | ICD-10-CM | POA: Diagnosis not present

## 2023-07-22 DIAGNOSIS — R159 Full incontinence of feces: Secondary | ICD-10-CM | POA: Insufficient documentation

## 2023-07-22 DIAGNOSIS — J45909 Unspecified asthma, uncomplicated: Secondary | ICD-10-CM | POA: Insufficient documentation

## 2023-07-22 DIAGNOSIS — Z6832 Body mass index (BMI) 32.0-32.9, adult: Secondary | ICD-10-CM | POA: Diagnosis not present

## 2023-07-22 DIAGNOSIS — I10 Essential (primary) hypertension: Secondary | ICD-10-CM | POA: Insufficient documentation

## 2023-07-22 DIAGNOSIS — E669 Obesity, unspecified: Secondary | ICD-10-CM | POA: Diagnosis not present

## 2023-07-22 HISTORY — DX: Major depressive disorder, single episode, in full remission: F32.5

## 2023-07-22 HISTORY — DX: Prediabetes: R73.03

## 2023-07-22 HISTORY — DX: Primary generalized (osteo)arthritis: M15.0

## 2023-07-22 HISTORY — DX: Cerebrovascular disease, unspecified: I67.9

## 2023-07-22 HISTORY — DX: Arthropathic psoriasis, unspecified: L40.50

## 2023-07-22 SURGERY — COLONOSCOPY
Anesthesia: General

## 2023-07-22 MED ORDER — SODIUM CHLORIDE 0.9 % IV SOLN
INTRAVENOUS | Status: DC
  Administered 2023-07-22: 1000 mL via INTRAVENOUS

## 2023-07-22 MED ORDER — PROPOFOL 10 MG/ML IV BOLUS
INTRAVENOUS | Status: DC | PRN
Start: 2023-07-22 — End: 2023-07-22
  Administered 2023-07-22: 40 mg via INTRAVENOUS
  Administered 2023-07-22 (×2): 20 mg via INTRAVENOUS
  Administered 2023-07-22: 30 mg via INTRAVENOUS
  Administered 2023-07-22 (×4): 20 mg via INTRAVENOUS

## 2023-07-22 MED ORDER — LIDOCAINE HCL (PF) 1 % IJ SOLN
INTRAMUSCULAR | Status: DC | PRN
  Administered 2023-07-22: 40 mg

## 2023-07-22 NOTE — Anesthesia Postprocedure Evaluation (Signed)
 Anesthesia Post Note  Patient: Andrea Fuentes  Procedure(s) Performed: COLONOSCOPY  Patient location during evaluation: Endoscopy Anesthesia Type: General Level of consciousness: awake and alert Pain management: pain level controlled Vital Signs Assessment: post-procedure vital signs reviewed and stable Respiratory status: spontaneous breathing, nonlabored ventilation, respiratory function stable and patient connected to nasal cannula oxygen Cardiovascular status: blood pressure returned to baseline and stable Postop Assessment: no apparent nausea or vomiting Anesthetic complications: no   No notable events documented.   Last Vitals:  Vitals:   07/22/23 0909 07/22/23 1028  BP: (!) 147/93 (!) 106/55  Pulse: 78 77  Resp: 16 18  Temp: (!) 36.1 C   SpO2: 100% (!) 10%    Last Pain:  Vitals:   07/22/23 1100  TempSrc:   PainSc: 0-No pain                 Lattie Poli

## 2023-07-22 NOTE — Interval H&P Note (Signed)
 History and Physical Interval Note:  07/22/2023 9:57 AM  Andrea Fuentes  has presented today for surgery, with the diagnosis of Chronic constipation with overflow incontinence (K59.09) Change in bowel habits (R19.4) Incontinence of feces, unspecified fecal incontinence type (R15.9).  The various methods of treatment have been discussed with the patient and family. After consideration of risks, benefits and other options for treatment, the patient has consented to  Procedure(s): COLONOSCOPY (N/A) as a surgical intervention.  The patient's history has been reviewed, patient examined, no change in status, stable for surgery.  I have reviewed the patient's chart and labs.  Questions were answered to the patient's satisfaction.     [name], [name]

## 2023-07-22 NOTE — Op Note (Signed)
 [facility] Gastroenterology Patient Name: Andrea Fuentes Procedure Date: 07/22/2023 10:06 AM MRN: [id number] Account #: [id number] Date of Birth: 1950/11/05 Admit Type: Outpatient Age: 73 Room: [facility] ENDO ROOM 3 Gender: Female Note Status: Finalized Instrument Name: Hyman Main [id number] Procedure:             Colonoscopy Indications:           Change in bowel habits, Fecal incontinence Providers:             Rayane Gallardo K. Toluwani Yadav MD, MD Medicines:             Propofol  per Anesthesia Complications:         No immediate complications. Estimated blood loss: None. Procedure:             Pre-Anesthesia Assessment:                        - The risks and benefits of the procedure and the                         sedation options and risks were discussed with the                         patient. All questions were answered and informed                         consent was obtained.                        - Patient identification and proposed procedure were                         verified prior to the procedure by the nurse. The                         procedure was verified in the procedure room.                        - ASA Grade Assessment: III - A patient with severe                         systemic disease.                        - After reviewing the risks and benefits, the patient                         was deemed in satisfactory condition to undergo the                         procedure.                        After obtaining informed consent, the colonoscope was                         passed under direct vision. Throughout the procedure,                         the patient's blood pressure, pulse, and oxygen  saturations were monitored continuously. The                         Colonoscope was introduced through the anus and                         advanced to the the cecum, identified by appendiceal                         orifice and ileocecal  valve. The colonoscopy was                         somewhat difficult due to restricted mobility of the                         colon. Successful completion of the procedure was                         aided by applying abdominal pressure. The patient                         tolerated the procedure well. The quality of the bowel                         preparation was adequate. The ileocecal valve,                         appendiceal orifice, and rectum were photographed. Findings:      The perianal and digital rectal examinations were normal. Pertinent       negatives include normal sphincter tone and no palpable rectal lesions.      Non-bleeding internal hemorrhoids were found during retroflexion. The       hemorrhoids were Grade I (internal hemorrhoids that do not prolapse).      Normal mucosa was found in the entire colon.      There is no endoscopic evidence of erythema, inflammation, mass, polyps,       stenosis, stricture or ulcerations in the entire colon.      A few medium-mouthed diverticula were found in the sigmoid colon.      The exam was otherwise without abnormality. Impression:            - Non-bleeding internal hemorrhoids.                        - Normal mucosa in the entire examined colon.                        - Diverticulosis in the sigmoid colon.                        - The examination was otherwise normal.                        - No specimens collected. Recommendation:        - Patient has a contact number available for                         emergencies. The signs and symptoms of potential  delayed complications were discussed with the patient.                         Return to normal activities tomorrow. Written                         discharge instructions were provided to the patient.                        - Resume previous diet.                        - Continue present medications.                        - Return to GI office  PRN.                        - Continue laxative therapy to avoid overflow                         incontinence. If any issues, please call our office                         [phone] to schedule another appointment with                         Andrea Mazzoni, PA-C. Thank you.                        - The findings and recommendations were discussed with                         the patient. Procedure Code(s):     --- Professional ---                        [phone], Colonoscopy, flexible; diagnostic, including                         collection of specimen(s) by brushing or washing, when                         performed (separate procedure) Diagnosis Code(s):     --- Professional ---                        K57.30, Diverticulosis of large intestine without                         perforation or abscess without bleeding                        R15.9, Full incontinence of feces                        R19.4, Change in bowel habit                        K64.0, First degree hemorrhoids CPT copyright 2022 American Medical Association. All rights reserved. The codes documented in this report are preliminary and upon coder review may  be revised to meet current compliance requirements. Cassie Click MD, MD 07/22/2023 10:31:29 AM This report has been signed electronically. Number of Addenda: 0 Note Initiated On: 07/22/2023 10:06 AM Scope Withdrawal Time: 0 hours 6 minutes 6 seconds  Total Procedure Duration: 0 hours 13 minutes 4 seconds  Estimated Blood Loss:  Estimated blood loss: none.      [facility]

## 2023-07-22 NOTE — Anesthesia Preprocedure Evaluation (Signed)
 Anesthesia Evaluation  Patient identified by MRN, date of birth, ID band Patient awake    Reviewed: Allergy & Precautions, NPO status , Patient's Chart, lab work & pertinent test results  History of Anesthesia Complications Negative for: history of anesthetic complications  Airway Mallampati: II  TM Distance: >3 FB Neck ROM: Full    Dental  (+) Teeth Intact   Pulmonary asthma , neg sleep apnea, neg COPD, Patient abstained from smoking.Not current smoker   Pulmonary exam normal breath sounds clear to auscultation       Cardiovascular Exercise Tolerance: Good METShypertension, Pt. on medications + Peripheral Vascular Disease  (-) CAD and (-) Past MI (-) dysrhythmias  Rhythm:Regular Rate:Normal - Systolic murmurs HLD   Neuro/Psych  Headaches PSYCHIATRIC DISORDERS Anxiety Depression    CVA, No Residual Symptoms    GI/Hepatic PUD,GERD  ,,(+)     (-) substance abuse    Endo/Other  neg diabetes  BMI 32  Renal/GU negative Renal ROS     Musculoskeletal   Abdominal   Peds  Hematology   Anesthesia Other Findings Past Medical History: No date: Anxiety No date: Asthma     Comment:  no issue for 8 years No date: Cancer (HCC)     Comment:  squamous cell ca rt forearm No date: Cerebrovascular disease No date: DDD (degenerative disc disease), lumbar     Comment:  osteo and psoriatic/ hips,legs,knees and shins, finger No date: Deafness in left ear No date: Depression No date: Endometriosis No date: GERD (gastroesophageal reflux disease) No date: Hammertoe     Comment:  right foot No date: Headache     Comment:  migraines/ 1 or 2 a year, migraines worse before               hysterectomy No date: History of esophagogastroduodenoscopy (EGD) No date: HOH (hard of hearing)     Comment:  bilateral/ aides No date: Hyperlipidemia No date: Hypertension     Comment:  controlled on meds No date: Low back pain No date: Lumbar  stenosis with neurogenic claudication No date: Major depression in remission (HCC) No date: Neuromuscular disorder (HCC) No date: Obesity No date: Peripheral vascular disease (HCC) No date: Pre-diabetes No date: Primary osteoarthritis involving multiple joints No date: Psoriasis     Comment:  hands and scalp No date: Psoriatic arthritis (HCC) No date: S/P ear surgery No date: Stomach ulcer due to nonsteroidal anti-inflammatory drug  (NSAID) No date: Stroke [facility])     Comment:  3 years ago/ no residual effects No date: Wears hearing aid in both ears  Reproductive/Obstetrics                             Anesthesia Physical Anesthesia Plan  ASA: 3  Anesthesia Plan: General   Post-op Pain Management: Minimal or no pain anticipated   Induction: Intravenous  PONV Risk Score and Plan: 2 and Propofol  infusion, TIVA and Ondansetron   Airway Management Planned: Nasal Cannula  Additional Equipment: None  Intra-op Plan:   Post-operative Plan:   Informed Consent: I have reviewed the patients History and Physical, chart, labs and discussed the procedure including the risks, benefits and alternatives for the proposed anesthesia with the patient or authorized representative who has indicated his/her understanding and acceptance.     Dental advisory given  Plan Discussed with: CRNA and Surgeon  Anesthesia Plan Comments: (Discussed risks of anesthesia with patient, including possibility of difficulty with spontaneous ventilation  under anesthesia necessitating airway intervention, PONV, and rare risks such as cardiac or respiratory or neurological events, and allergic reactions. Discussed the role of CRNA in patient's perioperative care. Patient understands.)        Anesthesia Quick Evaluation

## 2023-07-22 NOTE — H&P (Signed)
 Outpatient short stay form Pre-procedure 07/22/2023 9:54 AM Andrea Fuentes, M.D.  Primary Physician: Overton Blotter, M.D.  Reason for visit:  Fecal incontinence  History of present illness:  Andrea Fuentes presents to the [facility] GI clinic at the request of her PCP for chief complaint of change in bowel habits and fecal incontinence. She presents to the clinic by herself. She reports symptoms started last Summer. She endorses issues with alternating constipation and diarrhea. She can go upwards of a week without a BM and then have several days of very loose diarrhea with blowouts and incontinence. She is very embarrassed about her symptoms and finds it difficult to leave the house due to symptoms. She denies any nocturnal stooling. She denies any abdominal pain, hematochezia, melena, or steatorrhea. She has tried taking Imodium but doesn't like the way this makes her feel and worsens constipation. She has tried eliminating broccoli, salads, and raw vegetables but hasn't made a substantial difference. Appetite and diet are stable without any unintentional weight loss. Last colonoscopy Feb 2017 showed normal examined colon.     Current Facility-Administered Medications:    0.9 %  sodium chloride  infusion, , Intravenous, Continuous, Faison, Nairi Oswald K, MD, Last Rate: 20 mL/hr at 07/22/23 0951, Continued from Pre-op at 07/22/23 0951  Medications Prior to Admission  Medication Sig Dispense Refill Last Dose/Taking   aspirin EC 81 MG tablet Take 81 mg by mouth daily. Swallow whole.   Past Week   atorvastatin (LIPITOR) 80 MG tablet Take 80 mg by mouth daily. pm   07/21/2023   busPIRone (BUSPAR) 15 MG tablet Take 1 tablet by mouth 2 (two) times daily.   07/22/2023 Morning   gabapentin (NEURONTIN) 300 MG capsule Take 600 mg by mouth 2 (two) times daily. 1 tab AM, 3 tabs PM   07/22/2023 Morning   lisinopril (PRINIVIL,ZESTRIL) 10 MG tablet Take 10 mg by mouth daily. pm   07/21/2023   Melatonin 10 MG CAPS  Take by mouth daily. pm   07/21/2023   montelukast (SINGULAIR) 10 MG tablet Take 10 mg by mouth daily.   07/21/2023   Multiple Vitamins-Minerals (CENTRUM SILVER 50+WOMEN) TABS Take by mouth daily. am   Past Week   omeprazole (PRILOSEC) 20 MG capsule Take 20 mg by mouth daily. pm   07/21/2023   polyethylene glycol (MIRALAX / GLYCOLAX) 17 g packet Take 17 g by mouth every other day.   07/21/2023   solifenacin  (VESICARE ) 5 MG tablet Take 1 tablet (5 mg total) by mouth daily. 30 tablet 11 07/21/2023   traMADol (ULTRAM) 50 MG tablet Take 50 mg by mouth 4 (four) times daily. Am,noon,dinner,hs   07/22/2023 Morning   triamcinolone (NASACORT) 55 MCG/ACT AERO nasal inhaler Place 1 spray into the nose daily.   07/21/2023   venlafaxine (EFFEXOR) 75 MG tablet Take 75 mg by mouth 2 (two) times daily. am   Past Week   Vibegron  (GEMTESA ) 75 MG TABS Take 1 tablet (75 mg total) by mouth daily. 42 tablet 0 07/21/2023   VITAMIN D PO Take by mouth daily.   Past Week   acetaminophen  (TYLENOL ) 650 MG CR tablet Take 650 mg by mouth every 8 (eight) hours as needed for pain.      albuterol (PROVENTIL HFA;VENTOLIN HFA) 108 (90 Base) MCG/ACT inhaler Inhale 2 puffs into the lungs every 6 (six) hours as needed for wheezing or shortness of breath.      nitrofurantoin  (MACRODANTIN ) 100 MG capsule Take 1 capsule (100 mg total) by mouth  2 (two) times daily. (Patient not taking: Reported on 07/22/2023) 14 capsule 0 Completed Course     Allergies  Allergen Reactions   Penicillin G Swelling    lips   Nsaids Other (See Comments)    Gave a stomach ulcer   Shellfish Allergy Itching    Scallops only - Mouth itching   Iodine Rash    IV dye/ burning at site   Penicillin V Potassium Swelling and Rash     Past Medical History:  Diagnosis Date   Anxiety    Asthma    no issue for 8 years   Cancer (HCC)    squamous cell ca rt forearm   Cerebrovascular disease    DDD (degenerative disc disease), lumbar    osteo and psoriatic/  hips,legs,knees and shins, finger   Deafness in left ear    Depression    Endometriosis    GERD (gastroesophageal reflux disease)    Hammertoe    right foot   Headache    migraines/ 1 or 2 a year, migraines worse before hysterectomy   History of esophagogastroduodenoscopy (EGD)    HOH (hard of hearing)    bilateral/ aides   Hyperlipidemia    Hypertension    controlled on meds   Low back pain    Lumbar stenosis with neurogenic claudication    Major depression in remission (HCC)    Neuromuscular disorder (HCC)    Obesity    Peripheral vascular disease (HCC)    Pre-diabetes    Primary osteoarthritis involving multiple joints    Psoriasis    hands and scalp   Psoriatic arthritis (HCC)    S/P ear surgery    Stomach ulcer due to nonsteroidal anti-inflammatory drug (NSAID)    Stroke (HCC)    3 years ago/ no residual effects   Wears hearing aid in both ears     Review of systems:  Otherwise negative.    Physical Exam  Gen: Alert, oriented. Appears stated age.  HEENT: [place]/AT. PERRLA. Lungs: CTA, no wheezes. CV: RR nl S1, S2. Abd: soft, benign, no masses. BS+ Ext: No edema. Pulses 2+    Planned procedures: Proceed with colonoscopy. The patient understands the nature of the planned procedure, indications, risks, alternatives and potential complications including but not limited to bleeding, infection, perforation, damage to internal organs and possible oversedation/side effects from anesthesia. The patient agrees and gives consent to proceed.  Please refer to procedure notes for findings, recommendations and patient disposition/instructions.     Karley Pho K. Corky Fuentes, M.D. Gastroenterology 07/22/2023  9:54 AM

## 2023-07-22 NOTE — Transfer of Care (Signed)
 Immediate Anesthesia Transfer of Care Note  Patient: Andrea Fuentes  Procedure(s) Performed: COLONOSCOPY  Patient Location: PACU and Endoscopy Unit  Anesthesia Type:MAC  Level of Consciousness: drowsy  Airway & Oxygen Therapy: Patient Spontanous Breathing and Patient connected to nasal cannula oxygen  Post-op Assessment: Report given to RN and Post -op Vital signs reviewed and stable  Post vital signs: Reviewed and stable  Last Vitals:  Vitals Value Taken Time  BP 106/55 07/22/23 10:28  Temp    Pulse 74 07/22/23 10:29  Resp 17 07/22/23 10:29  SpO2 100 % 07/22/23 10:29  Vitals shown include unfiled device data.  Last Pain:  Vitals:   07/22/23 1028  TempSrc:   PainSc: 0-No pain         Complications: No notable events documented.

## 2023-08-01 ENCOUNTER — Other Ambulatory Visit: Payer: Self-pay | Admitting: Internal Medicine

## 2023-08-01 DIAGNOSIS — Z1231 Encounter for screening mammogram for malignant neoplasm of breast: Secondary | ICD-10-CM

## 2023-09-09 ENCOUNTER — Ambulatory Visit
Admission: RE | Admit: 2023-09-09 | Discharge: 2023-09-09 | Disposition: A | Discharge status: Home / Self Care | Source: Ambulatory Visit | Attending: Internal Medicine | Admitting: Internal Medicine

## 2023-09-09 DIAGNOSIS — Z1231 Encounter for screening mammogram for malignant neoplasm of breast: Secondary | ICD-10-CM | POA: Insufficient documentation

## 2023-09-29 ENCOUNTER — Ambulatory Visit: Admitting: Urology

## 2023-09-29 VITALS — BP 126/71 | HR 93 | Ht 61.0 in | Wt 139.0 lb

## 2023-09-29 DIAGNOSIS — R32 Unspecified urinary incontinence: Secondary | ICD-10-CM | POA: Diagnosis not present

## 2023-09-29 LAB — URINALYSIS, COMPLETE
Bilirubin, UA: NEGATIVE
Glucose, UA: NEGATIVE
Leukocytes,UA: NEGATIVE
Nitrite, UA: NEGATIVE
Protein,UA: NEGATIVE
RBC, UA: NEGATIVE
Specific Gravity, UA: 1.025 (ref 1.005–1.030)
Urobilinogen, Ur: 0.2 mg/dL (ref 0.2–1.0)
pH, UA: 6 (ref 5.0–7.5)

## 2023-09-29 LAB — MICROSCOPIC EXAMINATION: Bacteria, UA: NONE SEEN

## 2023-09-29 MED ORDER — SOLIFENACIN SUCCINATE 5 MG PO TABS: 5.0000 mg | ORAL_TABLET | Freq: Every day | ORAL | 11 refills | Status: AC

## 2023-09-29 NOTE — Progress Notes (Signed)
 09/29/2023 1:57 PM   Andrea Fuentes 07/06/1950 [id number]  Referring provider: Lenon Layman ORN, MD [address] [facility] [place],  [place] [address]  Chief Complaint  Patient presents with   Urinary Incontinence    HPI:  was consulted to assess the patient's urinary incontinence.  She has urge incontinence and it can be high-volume.  She leaks with coughing sneezing sometimes with bending lifting and with laughing.  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  She wears 4 heavy pads a day.  She said her stress incontinence is the worst but I felt that her overactive bladder symptoms were very significant   She voids every 60 to 90 minutes during the day and 4-5 times at night.  She reports ankle edema but does not take a diuretic   She had a bladder suspension hysterectomy years ago.  She has had a stroke.   She uses Desitin cream for redness of the perineum and may have an ulcer.   On pelvic examination she had a narrow introitus. No prolapse or stress incontinence with moderate cough. 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  She will return for cystoscopy and urodynamics.  She has frequency and nocturia.  Call if culture positive.  She likely has primarily an overactive bladder also in keeping with a physical examination.  Patient does not think her distant stroke that was asymptomatic about 5 years ago was related to her symptoms     No recent urine culture During urodynamics patient did not void and was catheterized to 50 mL.  Maximum bladder capacity 265 mL.  She increased bladder sensation.  Bladder stable.  Her cough leak point pressure 100 mL was 69 cm of water with moderate leakage.  Her cough leak point pressure 250 mL was 47 cm of water with moderate leakage.  In my opinion she did generate a detrusor  contraction.  Voiding pressure appears to be approximately 8 to 10 cm of water.  She did not generate a flow.  EMG activity normal.  She did positional changes.  She is allergic to contrast.  Warren felt that she was not generating detrusor contraction.     Patient has mixed incontinence with moderately low leak point pressures.  Patient knows a both are moderately severe she has an overactive bladder with urge incontinence and foot on the floor syndrome.  She does not reach her treatment goal with medical behavioral therapy I would offer her a bulking agent.  Patient could live with her stress incontinence I would offer her third line OAB therapy.  Reassess in 6 weeks on Gemtesa  samples and prescription.  Call if urine culture positive   Again the more I hear history the high-volume flooding episodes in my opinion are from her overactive bladder and not lower point pressures.  Cystoscopy next visit.  Call if culture positive   Last culture positive Patient states she was more than 50% better on Gemtesa  but to think it upset her bowel causing more bowel incontinence.  It was also $300. Cystoscopy: Patient underwent flexible cystoscopy.  Bladder mucosa and trigone were normal.  Initially I thought she may have mild changes of cystitis cystica but the exam was within normal limits.  Urine was little bit cloudy.  Urine was aspirated and sent for  culture Again she had a fixed bladder neck with narrow introitus and no stress incontinence.  eassess in 6 weeks on oxybutynin  ER 10 mg 30 x 11.  I am suspect that her primary problem again is overactive bladder and not stress incontinence.  Call if culture positive.  If culture positive consider prophylaxis.  Patient just got over pneumonia and COVID and is quite weak.  I think we need to have reasonable treatment goals at least in the short-term    Today Oxybutynin  caused dry mouth and patient put on Vesicare  Almost completely dry on Vesicare  and no infections.   Frequency improved variably       PMH: Past Medical History:  Diagnosis Date   Anxiety    Asthma    no issue for 8 years   Cancer (HCC)    squamous cell ca rt forearm   Cerebrovascular disease    DDD (degenerative disc disease), lumbar    osteo and psoriatic/ hips,legs,knees and shins, finger   Deafness in left ear    Depression    Endometriosis    GERD (gastroesophageal reflux disease)    Hammertoe    right foot   Headache    migraines/ 1 or 2 a year, migraines worse before hysterectomy   History of esophagogastroduodenoscopy (EGD)    HOH (hard of hearing)    bilateral/ aides   Hyperlipidemia    Hypertension    controlled on meds   Low back pain    Lumbar stenosis with neurogenic claudication    Major depression in remission (HCC)    Neuromuscular disorder (HCC)    Obesity    Peripheral vascular disease (HCC)    Pre-diabetes    Primary osteoarthritis involving multiple joints    Psoriasis    hands and scalp   Psoriatic arthritis (HCC)    S/P ear surgery    Stomach ulcer due to nonsteroidal anti-inflammatory drug (NSAID)    Stroke (HCC)    3 years ago/ no residual effects   Wears hearing aid in both ears     Surgical History: Past Surgical History:  Procedure Laterality Date   ABDOMINAL HYSTERECTOMY  02/05/1988   bilateral oopherectomy   BILATERAL OOPHORECTOMY     bladder tack     CATARACT EXTRACTION Bilateral    COLONOSCOPY     COLONOSCOPY N/A 07/22/2023   Procedure: COLONOSCOPY;  Surgeon: Toledo, Ladell POUR, MD;  Location: [facility] ENDOSCOPY;  Service: Gastroenterology;  Laterality: N/A;   COLONOSCOPY WITH PROPOFOL  N/A 03/27/2015   Procedure: COLONOSCOPY WITH PROPOFOL ;  Surgeon: Deward CINDERELLA Piedmont, MD;  Location: [facility] ENDOSCOPY;  Service: Gastroenterology;  Laterality: N/A;   EXCISION PARTIAL PHALANX Right 07/26/2015   Procedure: EXCISION PARTIAL PHALANX RIGHT 3RD TOE;  Surgeon: Eva Gay, DPM;  Location: [facility];  Service: Podiatry;  Laterality: Right;   WITH LOCAL   EYE SURGERY     HAMMER TOE SURGERY Right 07/26/2015   Procedure: HAMMER TOE CORRECTION RIGHT 4TH TOE;  Surgeon: Eva Gay, DPM;  Location: [facility];  Service: Podiatry;  Laterality: Right;   INNER EAR SURGERY Left    as a child/ ear drum   JOINT REPLACEMENT Bilateral    partial knee replacement   kidney stone removal     LIVER BIOPSY     NASAL SEPTOPLASTY W/ TURBINOPLASTY Bilateral 11/09/2020   Procedure: NASAL SEPTOPLASTY WITH INFERIOR TURBINATE REDUCTION;  Surgeon: Edda Deward, MD;  Location: [facility];  Service: ENT;  Laterality: Bilateral;   TONSILLECTOMY  Home Medications:  Allergies as of 09/29/2023       Reactions   Penicillin G Swelling   lips   Nsaids Other (See Comments)   Gave a stomach ulcer   Shellfish Allergy Itching   Scallops only - Mouth itching   Iodine Rash   IV dye/ burning at site   Penicillin V Potassium Swelling, Rash        Medication List        Accurate as of September 29, 2023  1:57 PM. If you have any questions, ask your nurse or doctor.          acetaminophen  650 MG CR tablet Commonly known as: TYLENOL  Take 650 mg by mouth every 8 (eight) hours as needed for pain.   albuterol 108 (90 Base) MCG/ACT inhaler Commonly known as: VENTOLIN HFA Inhale 2 puffs into the lungs every 6 (six) hours as needed for wheezing or shortness of breath.   aspirin EC 81 MG tablet Take 81 mg by mouth daily. Swallow whole.   atorvastatin 80 MG tablet Commonly known as: LIPITOR Take 80 mg by mouth daily. pm   busPIRone 15 MG tablet Commonly known as: BUSPAR Take 1 tablet by mouth 2 (two) times daily.   Centrum Silver 50+Women Tabs Take by mouth daily. am   gabapentin 300 MG capsule Commonly known as: NEURONTIN Take 600 mg by mouth 2 (two) times daily. 1 tab AM, 3 tabs PM   Gemtesa  75 MG Tabs Generic drug: Vibegron  Take 1 tablet (75 mg total) by mouth daily.   lisinopril 10 MG tablet Commonly known as:  ZESTRIL Take 10 mg by mouth daily. pm   Melatonin 10 MG Caps Take by mouth daily. pm   montelukast 10 MG tablet Commonly known as: SINGULAIR Take 10 mg by mouth daily.   nitrofurantoin  100 MG capsule Commonly known as: MACRODANTIN  Take 1 capsule (100 mg total) by mouth 2 (two) times daily.   omeprazole 20 MG capsule Commonly known as: PRILOSEC Take 20 mg by mouth daily. pm   polyethylene glycol 17 g packet Commonly known as: MIRALAX / GLYCOLAX Take 17 g by mouth every other day.   solifenacin  5 MG tablet Commonly known as: VESICARE  Take 1 tablet (5 mg total) by mouth daily.   traMADol 50 MG tablet Commonly known as: ULTRAM Take 50 mg by mouth 4 (four) times daily. Am,noon,dinner,hs   triamcinolone 55 MCG/ACT Aero nasal inhaler Commonly known as: NASACORT Place 1 spray into the nose daily.   venlafaxine 75 MG tablet Commonly known as: EFFEXOR Take 75 mg by mouth 2 (two) times daily. am   VITAMIN D PO Take by mouth daily.        Allergies:  Allergies  Allergen Reactions   Penicillin G Swelling    lips   Nsaids Other (See Comments)    Gave a stomach ulcer   Shellfish Allergy Itching    Scallops only - Mouth itching   Iodine Rash    IV dye/ burning at site   Penicillin V Potassium Swelling and Rash    Family History: Family History  Problem Relation Age of Onset   Heart disease Mother    Heart attack Father    Heart disease Father    Alcohol abuse Brother    Migraines Daughter    Breast cancer Neg Hx     Social History:  reports that she has never smoked. She has never been exposed to tobacco smoke. She has never used smokeless  tobacco. She reports that she does not drink alcohol and does not use drugs.  ROS:                                        Physical Exam: There were no vitals taken for this visit.  Constitutional:  Alert and oriented, No acute distress. HEENT: [place] AT, moist mucus membranes.  Trachea midline, no  masses.  Laboratory Data: No results found for: WBC, HGB, HCT, MCV, PLT  Lab Results  Component Value Date   CREATININE 0.73 05/14/2012    No results found for: PSA  No results found for: TESTOSTERONE  No results found for: HGBA1C  Urinalysis    Component Value Date/Time   APPEARANCEUR Clear 05/05/2023 1428   GLUCOSEU Negative 05/05/2023 1428   BILIRUBINUR Negative 05/05/2023 1428   PROTEINUR Negative 05/05/2023 1428   NITRITE Negative 05/05/2023 1428   LEUKOCYTESUR Negative 05/05/2023 1428    Pertinent Imaging:   Assessment & Plan: Vesicare  90 x 3 sent to pharmacy and I will see her in 1 year  1. Urinary incontinence, unspecified type (Primary)  - Urinalysis, Complete   No follow-ups on file.  Andrea Fuentes Elizabeth, MD  [facility] [address] [place], [place] [address] [phone]

## 2024-02-27 ENCOUNTER — Other Ambulatory Visit: Payer: Self-pay | Admitting: Ophthalmology

## 2024-02-27 DIAGNOSIS — H4912 Fourth [trochlear] nerve palsy, left eye: Secondary | ICD-10-CM

## 2024-03-09 ENCOUNTER — Ambulatory Visit
Admission: RE | Admit: 2024-03-09 | Discharge: 2024-03-09 | Disposition: A | Source: Ambulatory Visit | Attending: Ophthalmology | Admitting: Ophthalmology

## 2024-03-09 DIAGNOSIS — H4912 Fourth [trochlear] nerve palsy, left eye: Secondary | ICD-10-CM

## 2024-03-09 MED ORDER — GADOPICLENOL 0.5 MMOL/ML IV SOLN
7.5000 mL | Freq: Once | INTRAVENOUS | Status: AC | PRN
  Administered 2024-03-09: 7.5 mL via INTRAVENOUS

## 2024-03-11 IMAGING — MG MM DIGITAL SCREENING BILAT W/ TOMO AND CAD
6 of 12 series · 6 of 36 positions shown · non-contrast
Comparison: Previous exam(s).

ACR Breast Density Category a: The breast tissue is almost entirely
fatty.

CLINICAL DATA: Screening.

EXAM:
DIGITAL SCREENING BILATERAL MAMMOGRAM WITH TOMOSYNTHESIS AND CAD
TECHNIQUE: Bilateral screening digital craniocaudal and mediolateral oblique
mammograms were obtained. Bilateral screening digital breast
tomosynthesis was performed. The images were evaluated with
computer-aided detection.

[R CC synth-2D (1 of 2)]
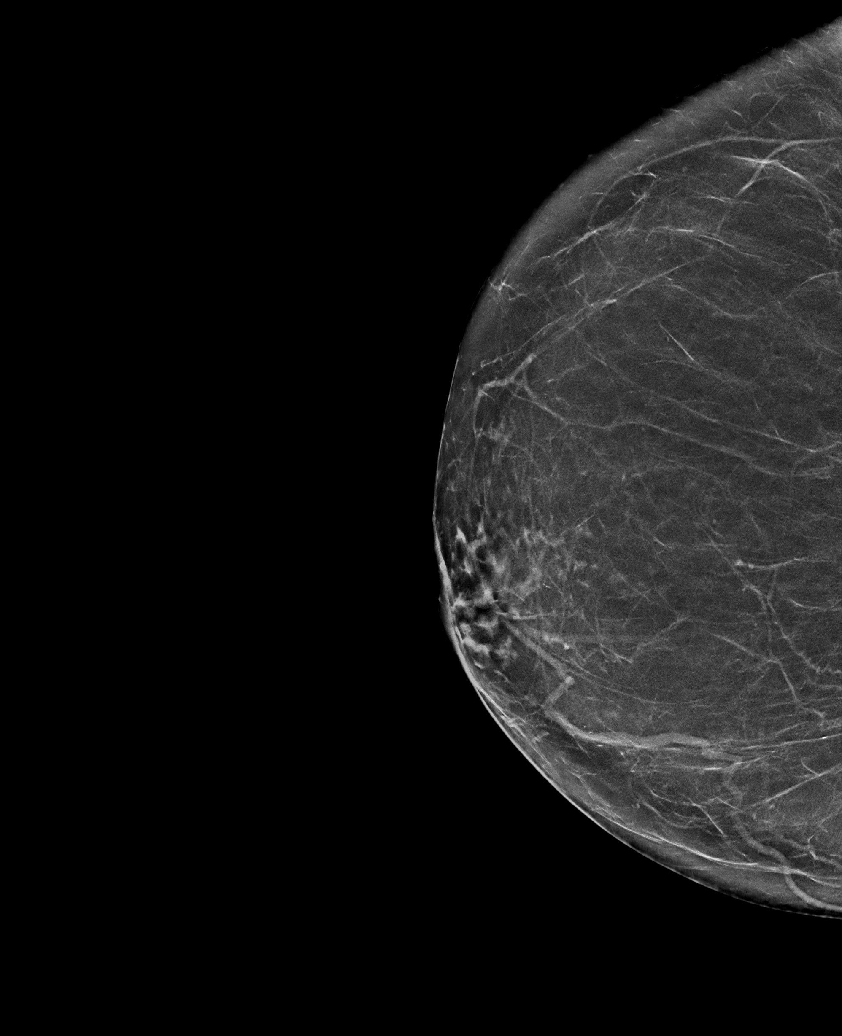

[L MLO synth-2D]
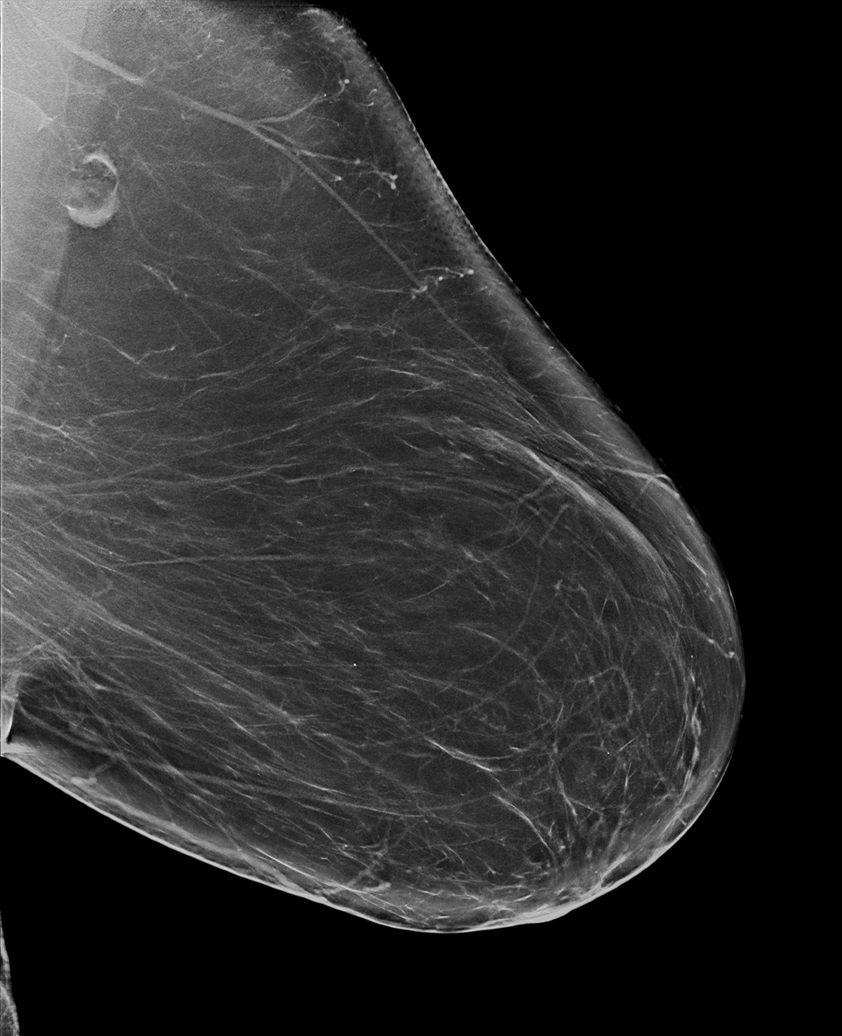

[R MLO synth-2D]
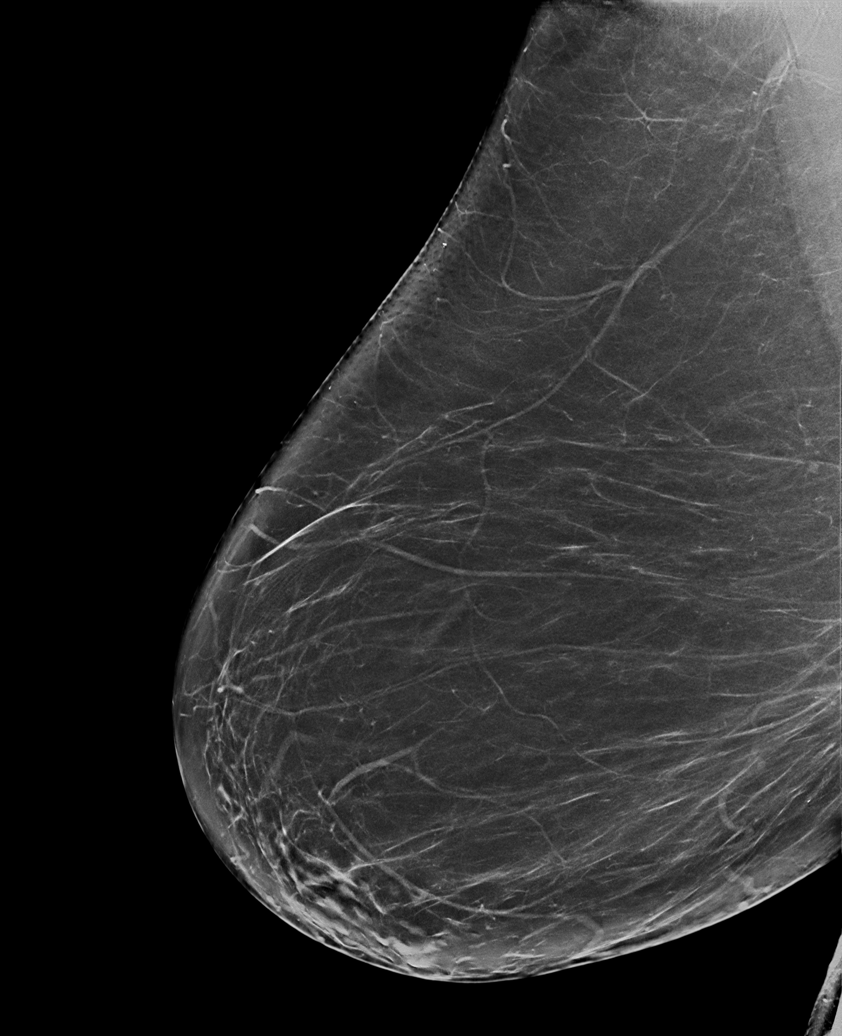

[L CC synth-2D (1 of 2)]
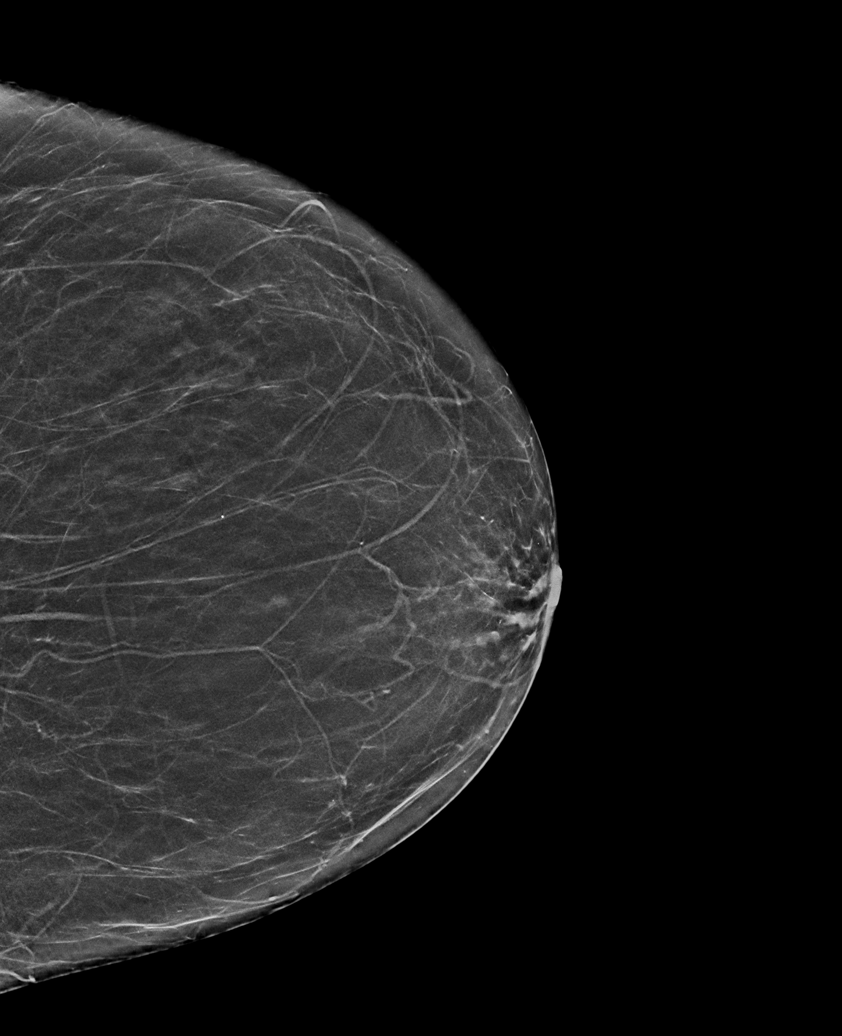

[R CC synth-2D (2 of 2)]
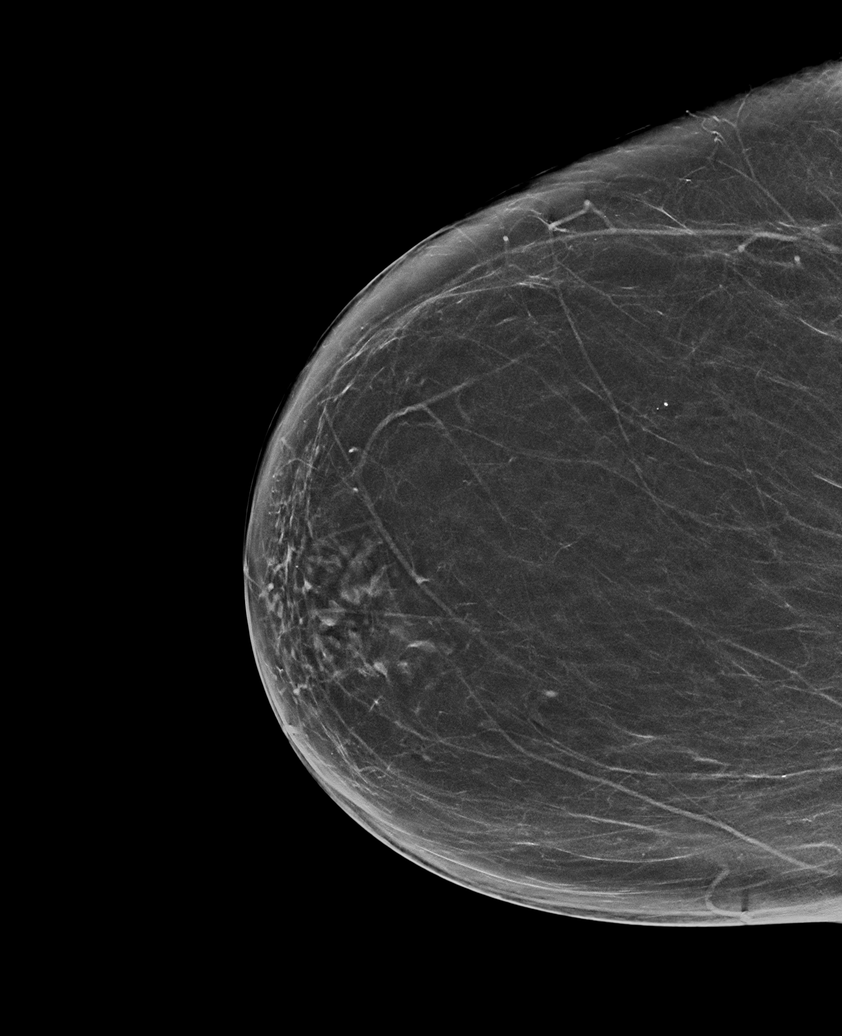

[L CC synth-2D (2 of 2)]
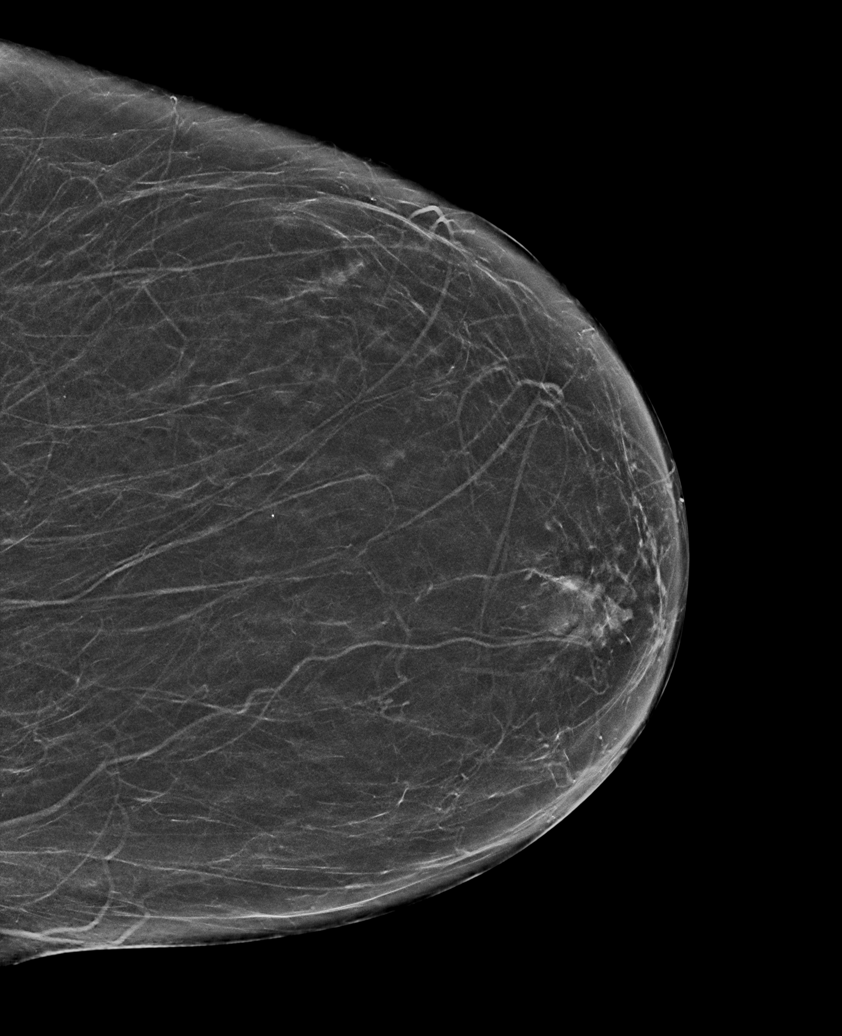

[6 of 36 positions shown; findings below may reference images not displayed]

FINDINGS: There are no findings suspicious for malignancy.
IMPRESSION: No mammographic evidence of malignancy. A result letter of this
screening mammogram will be mailed directly to the patient.

RECOMMENDATION:
Screening mammogram in one year. (Code:0E-3-N98)

BI-RADS CATEGORY  1: Negative.

## 2024-03-12 ENCOUNTER — Other Ambulatory Visit: Admission: RE | Admit: 2024-03-12 | Source: Ambulatory Visit | Admitting: Ophthalmology

## 2024-03-12 LAB — CBC
HCT: 37.7 % (ref 36.0–46.0)
Hemoglobin: 11.9 g/dL — ABNORMAL LOW (ref 12.0–15.0)
MCH: 29.9 pg (ref 26.0–34.0)
MCHC: 31.6 g/dL (ref 30.0–36.0)
MCV: 94.7 fL (ref 80.0–100.0)
Platelets: 335 10*3/uL (ref 150–400)
RBC: 3.98 MIL/uL (ref 3.87–5.11)
RDW: 13.9 % (ref 11.5–15.5)
WBC: 17.7 10*3/uL — ABNORMAL HIGH (ref 4.0–10.5)
nRBC: 0 % (ref 0.0–0.2)

## 2024-09-27 ENCOUNTER — Ambulatory Visit: Admitting: Urology
